# Patient Record
Sex: Male | Born: 1975
Health system: Southern US, Community
[De-identification: ages and names within clinical notes are randomized; demographics above are authoritative.]

## PROBLEM LIST (undated history)

## (undated) DIAGNOSIS — G4730 Sleep apnea, unspecified: Secondary | ICD-10-CM

## (undated) DIAGNOSIS — K219 Gastro-esophageal reflux disease without esophagitis: Secondary | ICD-10-CM

## (undated) DIAGNOSIS — E785 Hyperlipidemia, unspecified: Secondary | ICD-10-CM

## (undated) DIAGNOSIS — T7840XA Allergy, unspecified, initial encounter: Secondary | ICD-10-CM

## (undated) DIAGNOSIS — B029 Zoster without complications: Secondary | ICD-10-CM

## (undated) DIAGNOSIS — Z973 Presence of spectacles and contact lenses: Secondary | ICD-10-CM

## (undated) HISTORY — DX: Gastro-esophageal reflux disease without esophagitis: K21.9

## (undated) HISTORY — DX: Hyperlipidemia, unspecified: E78.5

## (undated) HISTORY — PX: WISDOM TOOTH EXTRACTION: SHX21

## (undated) HISTORY — DX: Allergy, unspecified, initial encounter: T78.40XA

## (undated) HISTORY — DX: Sleep apnea, unspecified: G47.30

## (undated) HISTORY — DX: Presence of spectacles and contact lenses: Z97.3

## (undated) HISTORY — PX: COLONOSCOPY: SHX174

---

## 2009-04-15 ENCOUNTER — Ambulatory Visit: Payer: Self-pay | Admitting: Occupational Medicine

## 2009-11-18 ENCOUNTER — Ambulatory Visit: Payer: Self-pay | Admitting: Emergency Medicine

## 2009-11-18 DIAGNOSIS — T6391XA Toxic effect of contact with unspecified venomous animal, accidental (unintentional), initial encounter: Secondary | ICD-10-CM | POA: Insufficient documentation

## 2010-06-03 NOTE — Letter (Signed)
Summary: Out of Work  [facility]  [address]   [place], [place] [address]   Phone: [phone]  Fax: [phone]    November 18, 2009   Employee:  [employer]    To Whom It May Concern:   For Medical reasons, please excuse the above named employee from work for the following dates:  July 18th, 2011   If you need additional information, please feel free to contact our office.         Sincerely,    Hoyt Koch MD

## 2010-06-03 NOTE — Assessment & Plan Note (Signed)
Summary: SWELLING LEFT KNEE FROM YELLOW JACKET ATTACK/TJ   Vital Signs:  Patient Profile:   35 Years Old Male CC:      several yellow jacket stings X yesterday Height:     72 inches Weight:      205 pounds O2 Sat:      98 % O2 treatment:    Room Air Temp:     97.2 degrees F oral Pulse rate:   93 / minute Resp:     14 per minute BP sitting:   123 / 81  (left arm) Cuff size:   large  Pt. in pain?   yes    Location:   left knee    Intensity:   6    Type:       burning  Vitals Entered By: Lajean Saver RN (November 18, 2009 3:12 PM)                   Updated Prior Medication List: No Medications Current Allergies (reviewed today): No known allergies History of Present Illness Chief Complaint: several yellow jacket stings X yesterday History of Present Illness: He was stung by several yellow jackets yesterday while mowing.  Once on his left knee and once on his left ankle.  This has happened before but he's never had a reaction.  Now with redness, pain, swelling at both sites.  Didn't try any OTC meds. Pain is sharp.  No SOB or breathing problems  REVIEW OF SYSTEMS Constitutional Symptoms      Denies fever, chills, night sweats, weight loss, weight gain, and fatigue.  Eyes       Denies change in vision, eye pain, eye discharge, glasses, contact lenses, and eye surgery. Ear/Nose/Throat/Mouth       Denies hearing loss/aids, change in hearing, ear pain, ear discharge, dizziness, frequent runny nose, frequent nose bleeds, sinus problems, sore throat, hoarseness, and tooth pain or bleeding.  Respiratory       Denies dry cough, productive cough, wheezing, shortness of breath, asthma, bronchitis, and emphysema/COPD.  Cardiovascular       Denies murmurs, chest pain, and tires easily with exhertion.    Gastrointestinal       Denies stomach pain, nausea/vomiting, diarrhea, constipation, blood in bowel movements, and indigestion. Genitourniary       Denies painful urination, kidney  stones, and loss of urinary control. Neurological       Denies paralysis, seizures, and fainting/blackouts. Musculoskeletal       Complains of redness and swelling.      Denies muscle pain, joint pain, joint stiffness, decreased range of motion, muscle weakness, and gout.      Comments: left knee and ankle Skin       Denies bruising, unusual mles/lumps or sores, and hair/skin or nail changes.      Comments: "puncture" wound/stings from yellow jackets Psych       Denies mood changes, temper/anger issues, anxiety/stress, speech problems, depression, and sleep problems. Other Comments: patient was stung by several yellow jackets yesterday. The greatest reaction is to his left knee. He denies an allergy to bee/yellow jacket stings. Denies SOB. Cold pack applied to left knee   Past History:  Past Medical History: Reviewed history from 04/15/2009 and no changes required. Unremarkable  Past Surgical History: Reviewed history from 04/15/2009 and no changes required. Denies surgical history  Family History: Reviewed history from 04/15/2009 and no changes required. Mother, Healthy Father, Diabetes, [place]  Social History: Reviewed history from 04/15/2009 and  no changes required. Non-smoker ETOH-yes No DRugs IT Physical Exam General appearance: well developed, well nourished, no acute distress Oral/Pharynx: tongue normal, posterior pharynx without erythema or exudate.  patent Chest/Lungs: no rales, wheezes, or rhonchi bilateral, breath sounds equal without effort Heart: regular rate and  rhythm, no murmur Extremities: FROM Skin: Left knee and left lateral ankle with apparent bee stings and surrounding erythema, warmth, and tenderness.  Clear discharge from knee wound. MSE: oriented to time, place, and person Assessment New Problems: BEE STING REACTION, LOCAL (ICD-989.5)  It is unclear whether this is cellulitis or simple inflammation from the sting. Patient prefers to wait and not get  prednisone or injection at this point  Patient Education: Patient and/or caregiver instructed in the following: rest, Tylenol prn, Ibuprofen prn.  Plan New Medications/Changes: DOXYCYCLINE HYCLATE 100 MG CAPS (DOXYCYCLINE HYCLATE) 1 tab by mouth two times a day for 10 days  #20 x 0, 11/18/2009, Hoyt Koch MD  New Orders: New Patient Level II [phone] Planning Comments:   Elevate and ice areas.  Advised to mark redness borders with a pen, and if spreading despite the antibiotics, return to clinic or f/u with your PCP .  Avoid bees and other stinging insects.  Benedryl may help.   The patient and/or caregiver has been counseled thoroughly with regard to medications prescribed including dosage, schedule, interactions, rationale for use, and possible side effects and they verbalize understanding.  Diagnoses and expected course of recovery discussed and will return if not improved as expected or if the condition worsens. Patient and/or caregiver verbalized understanding.  Prescriptions: DOXYCYCLINE HYCLATE 100 MG CAPS (DOXYCYCLINE HYCLATE) 1 tab by mouth two times a day for 10 days  #20 x 0   Entered and Authorized by:   Hoyt Koch MD   Signed by:   Hoyt Koch MD on 11/18/2009   Method used:   Print then Give to Patient   RxID:   [id number]   Orders Added: 1)  New Patient Level II [08657]

## 2010-11-29 ENCOUNTER — Encounter: Payer: Self-pay | Admitting: Family Medicine

## 2010-12-04 ENCOUNTER — Encounter: Payer: Self-pay | Admitting: Family Medicine

## 2010-12-04 ENCOUNTER — Ambulatory Visit (INDEPENDENT_AMBULATORY_CARE_PROVIDER_SITE_OTHER): Payer: BC Managed Care – PPO | Admitting: Family Medicine

## 2010-12-04 VITALS — BP 124/82 | HR 65 | Ht 71.75 in | Wt 212.0 lb

## 2010-12-04 DIAGNOSIS — Z0000 Encounter for general adult medical examination without abnormal findings: Secondary | ICD-10-CM

## 2010-12-04 DIAGNOSIS — M778 Other enthesopathies, not elsewhere classified: Secondary | ICD-10-CM

## 2010-12-04 DIAGNOSIS — R6884 Jaw pain: Secondary | ICD-10-CM

## 2010-12-04 DIAGNOSIS — Z23 Encounter for immunization: Secondary | ICD-10-CM

## 2010-12-04 NOTE — Patient Instructions (Signed)
Review handout for exercises for your wrist Wear the braces during the daytime for one week. Can take Aleve twice a day for one week as well, with food and water.  Then dec time to half days and then can start the exercises Looks at your work position of your wrist, and keep them elevated.

## 2010-12-04 NOTE — Progress Notes (Signed)
Subjective:    Patient ID: Micheal Hines, male    DOB: 06-08-1975, 35 y.o.   MRN: [id number]  HPI  He is healthy male who is hear for a CPE. Last was about 3-4 yr ago. Says last chol was borderline.   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  Has soreness in both wrists, but worse on the left.  Also notices popping in the left wrist which is uncomfortable.  hasn't really taken any meds for his wrist.  Did buy some braces and says they do help.  Occ numbness and tinlging in fingers but rarely. Only rarely does it bother him at night.   Left Jaw pain intermittantly for 10 years.  Says painful when shifts jaw outwards.  Will occ feel a pop when eating.  Occ uncomfortable.  No sure if griding teeth at night. Says doesn't chew gum.  No prior injury or surgery to the jaw.  Has dentist appt in the next few weeks.   Review of Systems  Constitutional: Negative for fever, diaphoresis and unexpected weight change.  HENT: Negative for hearing loss, rhinorrhea, sneezing and tinnitus.   Eyes: Negative for visual disturbance.  Respiratory: Negative for cough and wheezing.   Cardiovascular: Negative for chest pain and palpitations.  Gastrointestinal: Negative for nausea, vomiting, diarrhea and blood in stool.  Genitourinary: Negative for dysuria and discharge.  Musculoskeletal: Negative for myalgias and arthralgias.  Skin: Negative for rash.  Neurological: Negative for headaches.  Hematological: Negative for adenopathy.  Psychiatric/Behavioral: Negative for sleep disturbance and dysphoric mood. The patient is not nervous/anxious.    BP 124/82  Pulse 65  Ht 5' 11.75" (1.822 m)  Wt 212 lb (96.163 kg)  BMI 28.95 kg/m2    No Known Allergies  History reviewed. No pertinent past medical history.  Past Surgical History  Procedure Date  . Wisdom tooth extraction     History   Social History  . Marital Status: Married    Spouse Name: N/A    Number of Children: N/A  . Years of Education: N/A    Occupational History  . Not on file.   Social History Main Topics  . Smoking status: Never Smoker   . Smokeless tobacco: Not on file  . Alcohol Use: Yes  . Drug Use: No  . Sexually Active: Not on file   Other Topics Concern  . Not on file   Social History Narrative  . No narrative on file    Family History  Problem Relation Age of Onset  . Diabetes Father   . Prostate cancer Maternal Grandfather   . Breast cancer Paternal Grandmother   . Hyperlipidemia Father     Micheal Hines does not currently have medications on file.     Objective:   Physical Exam  Constitutional: He appears well-developed and well-nourished.  Musculoskeletal:       Wrists with NROM and strength. Fingers with 6/6 strength. Neg tinnels and phalens sign.  Nontender and no swelling over the wrist joints.            Assessment & Plan:  Wrist tendonitis - Discussed dx. Recommend wearing splints during work for one week then dec to half a day for one week. NSAID for one week. Then start exercises for wrists. H.O given. Follow up if not improving in 3-4 weeks. Discussed looking at wrist position at work that is likely the exacerbating factor for his wrists.    Jaw Pain - Likely TMJ intermittantly over the years. Some  popping can be normal. Gave him reassurance and discussed not chewing gum, avoiding clenching his jaw and talking to his dentist to make sure no signs of nightime grinding of his teeth. They might even be able to xray jaw when they are xraying teeth to make sure looks ok.    He plans on scheduleing a CPE soon so will go ahead and give lab slip.

## 2010-12-05 ENCOUNTER — Telehealth: Payer: Self-pay | Admitting: Family Medicine

## 2010-12-05 LAB — COMPLETE METABOLIC PANEL WITH GFR
ALT: 27 U/L (ref 0–53)
CO2: 24 mEq/L (ref 19–32)
Calcium: 9.5 mg/dL (ref 8.4–10.5)
Chloride: 106 mEq/L (ref 96–112)
Creat: 0.88 mg/dL (ref 0.50–1.35)
GFR, Est African American: >60 mL/min (ref >60)
Total Protein: 7.4 g/dL (ref 6.0–8.3)

## 2010-12-05 LAB — LIPID PANEL
LDL Cholesterol: 155 mg/dL — ABNORMAL HIGH (ref 0–99)
Triglycerides: 160 mg/dL — ABNORMAL HIGH (ref <150)

## 2010-12-05 NOTE — Telephone Encounter (Signed)
Pt.notified

## 2010-12-05 NOTE — Telephone Encounter (Signed)
Call patient: Complete metabolic panel is normal. His cholesterol is high. It was rather high as well. I would like him to work on low-fat diet and regular exercise and recheck cholesterol in 6 months. If the cholesterol is also low and exercise will improve this.

## 2010-12-24 ENCOUNTER — Encounter: Payer: Self-pay | Admitting: Family Medicine

## 2011-01-07 ENCOUNTER — Ambulatory Visit (INDEPENDENT_AMBULATORY_CARE_PROVIDER_SITE_OTHER): Payer: BC Managed Care – PPO | Admitting: Family Medicine

## 2011-01-07 ENCOUNTER — Encounter: Payer: Self-pay | Admitting: Family Medicine

## 2011-01-07 VITALS — BP 115/71 | HR 74 | Ht 71.75 in | Wt 209.0 lb

## 2011-01-07 DIAGNOSIS — Z23 Encounter for immunization: Secondary | ICD-10-CM

## 2011-01-07 DIAGNOSIS — Z0000 Encounter for general adult medical examination without abnormal findings: Secondary | ICD-10-CM

## 2011-01-15 ENCOUNTER — Encounter: Payer: Self-pay | Admitting: Family Medicine

## 2011-01-15 NOTE — Patient Instructions (Signed)
Return in 1 year for follow up

## 2011-01-15 NOTE — Progress Notes (Signed)
  Subjective:    Patient ID: Micheal Hines, male    DOB: 1976/04/10, 35 y.o.   MRN: [id number]  HPI Patient is here for a PE. Reports no problem.   Review of Systems  Constitutional: Negative.   HENT: Negative.   Eyes: Negative.   Respiratory: Negative.   Cardiovascular: Negative.   Gastrointestinal: Negative.   Genitourinary: Negative.   Musculoskeletal: Negative.   Neurological: Negative.   Psychiatric/Behavioral: Negative.        Objective:   Physical Exam  Constitutional: He is oriented to person, place, and time. He appears well-developed and well-nourished.  HENT:  Head: Normocephalic and atraumatic.  Eyes: Pupils are equal, round, and reactive to light.  Neck: Normal range of motion. Neck supple.  Cardiovascular: Normal rate, regular rhythm and normal heart sounds.   Pulmonary/Chest: Effort normal and breath sounds normal.  Abdominal: Soft. Bowel sounds are normal.  Genitourinary: Rectum normal, prostate normal and penis normal. Guaiac negative stool.  Musculoskeletal: Normal range of motion.  Neurological: He is alert and oriented to person, place, and time. He has normal reflexes.  Skin: Skin is warm and dry.  Psychiatric: He has a normal mood and affect. His behavior is normal.          Assessment & Plan:   Return in 1 year for reevaluation.

## 2011-07-09 ENCOUNTER — Ambulatory Visit (INDEPENDENT_AMBULATORY_CARE_PROVIDER_SITE_OTHER): Payer: BC Managed Care – PPO | Admitting: Family Medicine

## 2011-07-09 ENCOUNTER — Ambulatory Visit: Payer: BC Managed Care – PPO | Admitting: Family Medicine

## 2011-07-09 ENCOUNTER — Encounter: Payer: Self-pay | Admitting: Family Medicine

## 2011-07-09 VITALS — BP 120/72 | HR 75 | Ht 71.75 in | Wt 210.0 lb

## 2011-07-09 DIAGNOSIS — R17 Unspecified jaundice: Secondary | ICD-10-CM

## 2011-07-09 DIAGNOSIS — E785 Hyperlipidemia, unspecified: Secondary | ICD-10-CM

## 2011-07-09 NOTE — Progress Notes (Signed)
  Subjective:    Patient ID: Micheal Hines, male    DOB: 03/19/1976, 36 y.o.   MRN: [id number]  Hyperlipidemia This is a new problem. This is a new diagnosis. Condition status: will reevaluate. He has no history of chronic renal disease or diabetes. Pertinent negatives include no chest pain, focal sensory loss, focal weakness or leg pain. He is currently on no antihyperlipidemic treatment. There are no compliance problems.  Risk factors for coronary artery disease include male sex and a sedentary lifestyle.    He has a lesion on the L thigh Review of Systems  Cardiovascular: Negative for chest pain.  Neurological: Negative for focal weakness.  All other systems reviewed and are negative.      BP 120/72  Pulse 75  Ht 5' 11.75" (1.822 m)  Wt 210 lb (95.255 kg)  BMI 28.68 kg/m2  SpO2 99% Objective:   Physical Exam  Constitutional: He is oriented to person, place, and time. He appears well-developed and well-nourished.  HENT:  Head: Normocephalic.  Neck: Normal range of motion. Neck supple.  Cardiovascular: Normal rate, regular rhythm and normal heart sounds.   Pulmonary/Chest: Effort normal.  Neurological: He is alert and oriented to person, place, and time.  Skin: Skin is warm and dry.       Lesion on L thigh          Assessment & Plan:   #1 hyperlipidemia talked about some natural alternatives. We'll check a lipid panel  #2  mildly elevated bilirubin check bilirubin level just make sure this is not an ongoing problem #3 will also check a A1c to evaluate diabetic status

## 2011-07-09 NOTE — Patient Instructions (Signed)
Bilirubin This is a test of the bile in the blood that is formed in the liver. This is composed mainly of broken down hemoglobin. Hemoglobin is the oxygen carrying material in your red blood cells. Hemoglobin is necessary to help the red blood cells carry oxygen throughout the body. When the bilirubin becomes too high, a person will become jaundiced. This means your skin turns yellow and the whites of your eyes turn yellow. This test is commonly done on newborn babies because their livers are not mature enough. This means the liver is not well enough developed to handle the breakdown products of the red blood cells. The bilirubin can become too high and eventually cause brain damage. When this test is elevated in a newborn, phototherapy is often used. This is a light therapy which helps break down the bilirubin in the blood to a form that can be gotten rid of by the kidneys. PREPARATION FOR TEST No preparation or fasting is needed. A blood sample or skin prick will be used to obtain a sample for testing. NORMAL FINDINGS Adult/elderly/child:  Total bilirubin: 0.3 to 1.0 mg/dl or 5.1 to 17 umol/L (SI units)   Indirect bilirubin: 0.2 to 0.8 mg/dl or 3.4 to 16.1 umol/L (SI units)   Direct bilirubin: 1.0 to 12.0 mg/dl or 09.6 to 045 umol/L (SI units)  Newborn:   Total bilirubin: 1.0 to 12.0 mg/dl or 40.9 to 811 umol/L (SI units)  Ranges for normal findings may vary among different laboratories and hospitals. You should always check with your doctor after having lab work or other tests done to discuss the meaning of your test results and whether your values are considered within normal limits. MEANING OF TEST  Your caregiver will go over the test results with you. Your caregiver will discuss the importance and meaning of your results. He or she will also discuss treatment options and additional tests, if needed. OBTAINING THE TEST RESULTS  It is your responsibility to obtain your test results. Ask the  lab or department performing the test when and how you will get your results. Document Released: 05/12/2004 Document Revised: 04/09/2011 Document Reviewed: 03/27/2008 [facility] Patient Information 2012 [place], [place].Hypertriglyceridemia  Diet for High blood levels of Triglycerides Most fats in food are triglycerides. Triglycerides in your blood are stored as fat in your body. High levels of triglycerides in your blood may put you at a greater risk for heart disease and stroke.  Normal triglyceride levels are less than 150 mg/dL. Borderline high levels are 150-199 mg/dl. High levels are 200 - 499 mg/dL, and very high triglyceride levels are greater than 500 mg/dL. The decision to treat high triglycerides is generally based on the level. For people with borderline or high triglyceride levels, treatment includes weight loss and exercise. Drugs are recommended for people with very high triglyceride levels. Many people who need treatment for high triglyceride levels have metabolic syndrome. This syndrome is a collection of disorders that often include: insulin resistance, high blood pressure, blood clotting problems, high cholesterol and triglycerides. TESTING PROCEDURE FOR TRIGLYCERIDES  You should not eat 4 hours before getting your triglycerides measured. The normal range of triglycerides is between 10 and 250 milligrams per deciliter (mg/dl). Some people may have extreme levels (1000 or above), but your triglyceride level may be too high if it is above 150 mg/dl, depending on what other risk factors you have for heart disease.   People with high blood triglycerides may also have high blood cholesterol levels. If you  have high blood cholesterol as well as high blood triglycerides, your risk for heart disease is probably greater than if you only had high triglycerides. High blood cholesterol is one of the main risk factors for heart disease.  CHANGING YOUR DIET  Your weight can affect your blood  triglyceride level. If you are more than 20% above your ideal body weight, you may be able to lower your blood triglycerides by losing weight. Eating less and exercising regularly is the best way to combat this. Fat provides more calories than any other food. The best way to lose weight is to eat less fat. Only 30% of your total calories should come from fat. Less than 7% of your diet should come from saturated fat. A diet low in fat and saturated fat is the same as a diet to decrease blood cholesterol. By eating a diet lower in fat, you may lose weight, lower your blood cholesterol, and lower your blood triglyceride level.  Eating a diet low in fat, especially saturated fat, may also help you lower your blood triglyceride level. Ask your dietitian to help you figure how much fat you can eat based on the number of calories your caregiver has prescribed for you.  Exercise, in addition to helping with weight loss may also help lower triglyceride levels.   Alcohol can increase blood triglycerides. You may need to stop drinking alcoholic beverages.   Too much carbohydrate in your diet may also increase your blood triglycerides. Some complex carbohydrates are necessary in your diet. These may include bread, rice, potatoes, other starchy vegetables and cereals.   Reduce "simple" carbohydrates. These may include pure sugars, candy, honey, and jelly without losing other nutrients. If you have the kind of high blood triglycerides that is affected by the amount of carbohydrates in your diet, you will need to eat less sugar and less high-sugar foods. Your caregiver can help you with this.   Adding 2-4 grams of fish oil (EPA+ DHA) may also help lower triglycerides. Speak with your caregiver before adding any supplements to your regimen.  Following the Diet  Maintain your ideal weight. Your caregivers can help you with a diet. Generally, eating less food and getting more exercise will help you lose weight. Joining a  weight control group may also help. Ask your caregivers for a good weight control group in your area.  Eat low-fat foods instead of high-fat foods. This can help you lose weight too.  These foods are lower in fat. Eat MORE of these:   Dried beans, peas, and lentils.   Egg whites.   Low-fat cottage cheese.   Fish.   Lean cuts of meat, such as round, sirloin, rump, and flank (cut extra fat off meat you fix).   Whole grain breads, cereals and pasta.   Skim and nonfat dry milk.   Low-fat yogurt.   Poultry without the skin.   Cheese made with skim or part-skim milk, such as mozzarella, parmesan, farmers', ricotta, or pot cheese.  These are higher fat foods. Eat LESS of these:   Whole milk and foods made from whole milk, such as American, blue, cheddar, monterey jack, and swiss cheese   High-fat meats, such as luncheon meats, sausages, knockwurst, bratwurst, hot dogs, ribs, corned beef, ground pork, and regular ground beef.   Fried foods.  Limit saturated fats in your diet. Substituting unsaturated fat for saturated fat may decrease your blood triglyceride level. You will need to read package labels to know which products  contain saturated fats.  These foods are high in saturated fat. Eat LESS of these:   Fried pork skins.   Whole milk.   Skin and fat from poultry.   Palm oil.   Butter.   Shortening.   Cream cheese.   Tomasa Blase.   Margarines and baked goods made from listed oils.   Vegetable shortenings.   Chitterlings.   Fat from meats.   Coconut oil.   Palm kernel oil.   Lard.   Cream.   Sour cream.   Fatback.   Coffee whiteners and non-dairy creamers made with these oils.   Cheese made from whole milk.  Use unsaturated fats (both polyunsaturated and monounsaturated) moderately. Remember, even though unsaturated fats are better than saturated fats; you still want a diet low in total fat.  These foods are high in unsaturated fat:   Canola oil.    Sunflower oil.   Mayonnaise.   Almonds.   Peanuts.   Pine nuts.   Margarines made with these oils.   Safflower oil.   Olive oil.   Avocados.   Cashews.   Peanut butter.   Sunflower seeds.   Soybean oil.   Peanut oil.   Olives.   Pecans.   Walnuts.   Pumpkin seeds.  Avoid sugar and other high-sugar foods. This will decrease carbohydrates without decreasing other nutrients. Sugar in your food goes rapidly to your blood. When there is excess sugar in your blood, your liver may use it to make more triglycerides. Sugar also contains calories without other important nutrients.  Eat LESS of these:   Sugar, brown sugar, powdered sugar, jam, jelly, preserves, honey, syrup, molasses, pies, candy, cakes, cookies, frosting, pastries, colas, soft drinks, punches, fruit drinks, and regular gelatin.   Avoid alcohol. Alcohol, even more than sugar, may increase blood triglycerides. In addition, alcohol is high in calories and low in nutrients. Ask for sparkling water, or a diet soft drink instead of an alcoholic beverage.  Suggestions for planning and preparing meals   Bake, broil, grill or roast meats instead of frying.   Remove fat from meats and skin from poultry before cooking.   Add spices, herbs, lemon juice or vinegar to vegetables instead of salt, rich sauces or gravies.   Use a non-stick skillet without fat or use no-stick sprays.   Cool and refrigerate stews and broth. Then remove the hardened fat floating on the surface before serving.   Refrigerate meat drippings and skim off fat to make low-fat gravies.   Serve more fish.   Use less butter, margarine and other high-fat spreads on bread or vegetables.   Use skim or reconstituted non-fat dry milk for cooking.   Cook with low-fat cheeses.   Substitute low-fat yogurt or cottage cheese for all or part of the sour cream in recipes for sauces, dips or congealed salads.   Use half yogurt/half mayonnaise in  salad recipes.   Substitute evaporated skim milk for cream. Evaporated skim milk or reconstituted non-fat dry milk can be whipped and substituted for whipped cream in certain recipes.   Choose fresh fruits for dessert instead of high-fat foods such as pies or cakes. Fruits are naturally low in fat.  When Dining Out   Order low-fat appetizers such as fruit or vegetable juice, pasta with vegetables or tomato sauce.   Select clear, rather than cream soups.   Ask that dressings and gravies be served on the side. Then use less of them.   Order foods that  are baked, broiled, poached, steamed, stir-fried, or roasted.   Ask for margarine instead of butter, and use only a small amount.   Drink sparkling water, unsweetened tea or coffee, or diet soft drinks instead of alcohol or other sweet beverages.  QUESTIONS AND ANSWERS ABOUT OTHER FATS IN THE BLOOD: SATURATED FAT, TRANS FAT, AND CHOLESTEROL What is trans fat? Trans fat is a type of fat that is formed when vegetable oil is hardened through a process called hydrogenation. This process helps makes foods more solid, gives them shape, and prolongs their shelf life. Trans fats are also called hydrogenated or partially hydrogenated oils.  What do saturated fat, trans fat, and cholesterol in foods have to do with heart disease? Saturated fat, trans fat, and cholesterol in the diet all raise the level of LDL "bad" cholesterol in the blood. The higher the LDL cholesterol, the greater the risk for coronary heart disease (CHD). Saturated fat and trans fat raise LDL similarly.  What foods contain saturated fat, trans fat, and cholesterol? High amounts of saturated fat are found in animal products, such as fatty cuts of meat, chicken skin, and full-fat dairy products like butter, whole milk, cream, and cheese, and in tropical vegetable oils such as palm, palm kernel, and coconut oil. Trans fat is found in some of the same foods as saturated fat, such as  vegetable shortening, some margarines (especially hard or stick margarine), crackers, cookies, baked goods, fried foods, salad dressings, and other processed foods made with partially hydrogenated vegetable oils. Small amounts of trans fat also occur naturally in some animal products, such as milk products, beef, and lamb. Foods high in cholesterol include liver, other organ meats, egg yolks, shrimp, and full-fat dairy products. How can I use the new food label to make heart-healthy food choices? Check the Nutrition Facts panel of the food label. Choose foods lower in saturated fat, trans fat, and cholesterol. For saturated fat and cholesterol, you can also use the Percent Daily Value (%DV): 5% DV or less is low, and 20% DV or more is high. (There is no %DV for trans fat.) Use the Nutrition Facts panel to choose foods low in saturated fat and cholesterol, and if the trans fat is not listed, read the ingredients and limit products that list shortening or hydrogenated or partially hydrogenated vegetable oil, which tend to be high in trans fat. POINTS TO REMEMBER: YOU NEED A LITTLE TLC (THERAPEUTIC LIFESTYLE CHANGES)  Discuss your risk for heart disease with your caregivers, and take steps to reduce risk factors.   Change your diet. Choose foods that are low in saturated fat, trans fat, and cholesterol.   Add exercise to your daily routine if it is not already being done. Participate in physical activity of moderate intensity, like brisk walking, for at least 30 minutes on most, and preferably all days of the week. No time? Break the 30 minutes into three, 10-minute segments during the day.   Stop smoking. If you do smoke, contact your caregiver to discuss ways in which they can help you quit.   Do not use street drugs.   Maintain a normal weight.   Maintain a healthy blood pressure.   Keep up with your blood work for checking the fats in your blood as directed by your caregiver.  Document Released:  02/06/2004 Document Revised: 04/09/2011 Document Reviewed: 09/03/2008 [facility] Patient Information 2012 [place], [place].

## 2011-07-11 LAB — LIPID PANEL
Cholesterol: 196 mg/dL (ref 0–200)
Triglycerides: 108 mg/dL (ref <150)

## 2011-07-11 LAB — HEMOGLOBIN A1C
Hgb A1c MFr Bld: 5.4 % (ref <5.7)
Mean Plasma Glucose: 108 mg/dL (ref <117)

## 2011-08-07 ENCOUNTER — Encounter: Payer: Self-pay | Admitting: *Deleted

## 2011-08-13 ENCOUNTER — Encounter: Payer: Self-pay | Admitting: Family Medicine

## 2011-08-13 ENCOUNTER — Other Ambulatory Visit: Payer: Self-pay | Admitting: Family Medicine

## 2011-08-13 ENCOUNTER — Ambulatory Visit (INDEPENDENT_AMBULATORY_CARE_PROVIDER_SITE_OTHER): Payer: BC Managed Care – PPO | Admitting: Family Medicine

## 2011-08-13 VITALS — BP 131/75 | HR 85 | Ht 71.75 in | Wt 211.0 lb

## 2011-08-13 DIAGNOSIS — L989 Disorder of the skin and subcutaneous tissue, unspecified: Secondary | ICD-10-CM | POA: Insufficient documentation

## 2011-08-13 DIAGNOSIS — D485 Neoplasm of uncertain behavior of skin: Secondary | ICD-10-CM

## 2011-08-13 MED ORDER — MUPIROCIN 2 % EX OINT: TOPICAL_OINTMENT | Freq: Three times a day (TID) | CUTANEOUS | Status: DC

## 2011-08-13 NOTE — Patient Instructions (Signed)
Biopsy Care After Refer to this sheet in the next few weeks. These instructions provide you with information on caring for yourself after your procedure. Your caregiver may also give you more specific instructions. Your treatment has been planned according to current medical practices, but problems sometimes occur. Call your caregiver if you have any problems or questions after your procedure. If you had a fine needle biopsy, you may have soreness at the biopsy site for 1 to 2 days. If you had an open biopsy, you may have soreness at the biopsy site for 3 to 4 days. HOME CARE INSTRUCTIONS   You may resume normal diet and activities as directed.   Change bandages (dressings) as directed. If your wound was closed with a skin glue (adhesive), it will wear off and begin to peel in 7 days.   Only take over-the-counter or prescription medicines for pain, discomfort, or fever as directed by your caregiver.   Ask your caregiver when you can bathe and get your wound wet.  SEEK IMMEDIATE MEDICAL CARE IF:   You have increased bleeding (more than a small spot) from the biopsy site.   You notice redness, swelling, or increasing pain at the biopsy site.   You have pus coming from the biopsy site.   You have a fever.   You notice a bad smell coming from the biopsy site or dressing.   You have a rash, have difficulty breathing, or have any allergic problems.  MAKE SURE YOU:   Understand these instructions.   Will watch your condition.   Will get help right away if you are not doing well or get worse.  Document Released: 11/07/2004 Document Revised: 04/09/2011 Document Reviewed: 10/16/2010 [facility] Patient Information 2012 [place], [place].Biopsy A biopsy is a procedure in which small samples of tissue are removed from the body. The tissue is examined under a microscope. A biopsy may be done to determine the cause (diagnosis) of a condition or mass (tumor). A biopsy may also be done to determine the  best treatment for you. In some instances, a biopsy may be performed on normal tissue to determine if cancer has spread or if a transplanted organ is being rejected. There are 2 ways to obtain samples:  Fine needle biopsy. Samples are removed using a thin needle inserted through the skin.   Open biopsy. Samples are removed after a cut (incision) is made through the skin.  LET YOUR CAREGIVER KNOW ABOUT:  Allergies to food or medicine.   Medicines taken, including vitamins, herbs, eyedrops, over-the-counter medicines, and creams.   Use of steroids (by mouth or creams).   Previous problems with anesthetics or numbing medicines.   History of bleeding problems or blood clots.   Previous surgery.   Other health problems, including diabetes and kidney problems.   Possibility of pregnancy, if this applies.  RISKS AND COMPLICATIONS  Bleeding from the biopsy site. The risk of bleeding is higher if you have a bleeding disorder or are taking any blood thinning medicines (anticoagulants).   Infection.   Injury to organs or structures near the biopsy site.   Chronic pain at the biopsy site. This is defined as pain that lasts for more than 3 months.   Very rarely, a second biopsy may be required if not enough tissue was collected during the first biopsy.  BEFORE THE PROCEDURE Ask your caregiver what time you need to arrive for your procedure. Ask your caregiver whether you need to stop eating or drinking (fast)  before your procedure. Ask your caregiver about changing or stopping your regular medicines. A blood sample may be done to determine your blood clotting time. Medicine may be given to help you relax (sedative). PROCEDURE During a fine needle biopsy, you will be awake during the procedure. You will be positioned to allow the best possible access to the biopsy site. Let your caregiver know if the position is not comfortable. The biopsy site will be cleaned. A needle is inserted through your  skin. You may feel mild discomfort during this procedure. The needle is withdrawn once tissue samples have been removed. Pressure may be applied to the biopsy site to reduce swelling and to ensure that bleeding has stopped. The samples will be sent to be examined. During an open biopsy, you may be given medicine that numbs the area (local anesthetic) or medicine that makes you sleep (general anesthetic). An incision is made through the skin. A tissue sample or the entire mass is removed. The sample or mass will be sent to be examined. Sometimes, the sample or mass may be examined during the procedure. If the sample or mass contains cancer cells, further tissue or structures may be removed. The incision is then closed with stitches (sutures) or skin glue (adhesive). AFTER THE PROCEDURE Your recovery will be assessed and monitored. If there are no problems, you should be able to go home shortly after the procedure (outpatient). You will need to arrange for someone to drive you home if you received a sedative or pain relieving medicine during the procedure. Ask when your test results will be ready. Make sure you get your test results. Document Released: 04/17/2000 Document Revised: 04/09/2011 Document Reviewed: 10/16/2010 [facility] Patient Information 2012 [place], [place].

## 2011-08-13 NOTE — Progress Notes (Addendum)
Patient ID: Micheal Hines, male   DOB: 08/02/1975, 36 y.o.   MRN: [id number] Patient is here for skin lesion removal on his left eye please see the biopsy note for details.  Overview  Punch Biopsy Procedure Note   Punch Biopsy Procedure Note  Pre-operative Diagnosis: Suspicious lesion  Post-operative Diagnosis: same  Locations:left thigh  Indications: rapid growth  Anesthesia: Lidocaine 1% without epinephrine without added sodium bicarbonate  Procedure Details  History of allergy to iodine: no Patient informed of the risks (including bleeding and infection) and benefits of the  procedure and Verbal informed consent obtained.  The lesion and surrounding area was given a sterile prep using betadyne and draped in the usual sterile fashion. The skin was then stretched perpendicular to the skin tension lines and the lesion removed using the 6mm punch. The resulting ellipse was then closed. The wound was closed with 3-0 Nylon using simple interrupted stitches. Antibiotic ointment and a sterile dressing applied. The specimen was sent for pathologic examination. The patient tolerated the procedure well.  EBL: minimal blood loss   Findings: pending  Condition: Stable  Complications: none.  Plan: 1. Instructed to keep the wound dry and covered for 24-48h and clean thereafter. 2. Warning signs of infection were reviewed.   3. Recommended that the patient use NSAID as needed for pain.  4. Return for suture removal in 12 days.

## 2011-08-18 MED ORDER — MUPIROCIN 2 % EX OINT: TOPICAL_OINTMENT | Freq: Three times a day (TID) | CUTANEOUS | Status: DC

## 2011-08-18 NOTE — Progress Notes (Signed)
Addended by: Ellsworth Lennox on: 08/18/2011 08:09 AM   Modules accepted: Orders

## 2011-08-21 ENCOUNTER — Encounter: Payer: Self-pay | Admitting: Family Medicine

## 2011-08-21 ENCOUNTER — Encounter: Payer: Self-pay | Admitting: *Deleted

## 2011-08-21 ENCOUNTER — Ambulatory Visit (INDEPENDENT_AMBULATORY_CARE_PROVIDER_SITE_OTHER): Payer: BC Managed Care – PPO | Admitting: Family Medicine

## 2011-08-21 VITALS — BP 131/81 | HR 56 | Temp 98.4°F | Ht 71.75 in | Wt 212.0 lb

## 2011-08-21 DIAGNOSIS — L0390 Cellulitis, unspecified: Secondary | ICD-10-CM

## 2011-08-21 DIAGNOSIS — L0291 Cutaneous abscess, unspecified: Secondary | ICD-10-CM

## 2011-08-21 MED ORDER — MUPIROCIN 2 % EX OINT: TOPICAL_OINTMENT | Freq: Three times a day (TID) | CUTANEOUS | Status: AC

## 2011-08-21 MED ORDER — SULFAMETHOXAZOLE-TRIMETHOPRIM 800-160 MG PO TABS: 1.0000 | ORAL_TABLET | Freq: Two times a day (BID) | ORAL | Status: AC

## 2011-08-21 NOTE — Progress Notes (Signed)
  Subjective:    Patient ID: Micheal Hines, male    DOB: Jul 09, 1975, 36 y.o.   MRN: [id number]  HPI Redness around excision site status post biopsy of cyst on left thigh. He says over the last day or 2 it started to look more red it has become more tender and sore. He denies any active drainage or pus from the lesion.   Review of Systems     Objective:   Physical Exam  The sutures are intact and there is no active drainage or pus. The site does look very erythematous and is very tender and blanches very easily.      Assessment & Plan:   Early cellulitis around wound site. And treat with Bactrim twice a day for 10 days. Keep appointment on Tuesday for suture removal. If at that point in time it looks great and he may be a postoperative Bactrim. He can certainly discuss that with Dr. Thurmond Butts. Also he never picked up the prescription for Bactroban. Evidently was sent to the pharmacy but I think there was some confusion. I resent it to [facility] on [address]. He has been using over-the-counter triple antibiotic ointment instead.

## 2011-08-21 NOTE — Patient Instructions (Signed)
Keep appointment for Tuesday.

## 2011-08-25 ENCOUNTER — Encounter: Payer: Self-pay | Admitting: Family Medicine

## 2011-08-25 ENCOUNTER — Ambulatory Visit (INDEPENDENT_AMBULATORY_CARE_PROVIDER_SITE_OTHER): Payer: BC Managed Care – PPO | Admitting: Family Medicine

## 2011-08-25 VITALS — BP 105/67 | HR 69 | Ht 71.75 in | Wt 213.0 lb

## 2011-08-25 DIAGNOSIS — Z9889 Other specified postprocedural states: Secondary | ICD-10-CM

## 2011-08-25 DIAGNOSIS — Z4802 Encounter for removal of sutures: Secondary | ICD-10-CM

## 2011-08-25 NOTE — Patient Instructions (Signed)
Biopsy Care After Refer to this sheet in the next few weeks. These instructions provide you with information on caring for yourself after your procedure. Your caregiver may also give you more specific instructions. Your treatment has been planned according to current medical practices, but problems sometimes occur. Call your caregiver if you have any problems or questions after your procedure. If you had a fine needle biopsy, you may have soreness at the biopsy site for 1 to 2 days. If you had an open biopsy, you may have soreness at the biopsy site for 3 to 4 days. HOME CARE INSTRUCTIONS   You may resume normal diet and activities as directed.   Change bandages (dressings) as directed. If your wound was closed with a skin glue (adhesive), it will wear off and begin to peel in 7 days.   Only take over-the-counter or prescription medicines for pain, discomfort, or fever as directed by your caregiver.   Ask your caregiver when you can bathe and get your wound wet.  SEEK IMMEDIATE MEDICAL CARE IF:   You have increased bleeding (more than a small spot) from the biopsy site.   You notice redness, swelling, or increasing pain at the biopsy site.   You have pus coming from the biopsy site.   You have a fever.   You notice a bad smell coming from the biopsy site or dressing.   You have a rash, have difficulty breathing, or have any allergic problems.  MAKE SURE YOU:   Understand these instructions.   Will watch your condition.   Will get help right away if you are not doing well or get worse.  Document Released: 11/07/2004 Document Revised: 04/09/2011 Document Reviewed: 10/16/2010 [facility] Patient Information 2012 [place], [place].Wound Care Wound care helps prevent pain and infection.  You may need a tetanus shot if:  You cannot remember when you had your last tetanus shot.   You have never had a tetanus shot.   The injury broke your skin.  If you need a tetanus shot and you choose  not to have one, you may get tetanus. Sickness from tetanus can be serious. HOME CARE   Only take medicine as told by your doctor.   Clean the wound daily with mild soap and water.   Change any bandages (dressings) as told by your doctor.   Put medicated cream and a bandage on the wound as told by your doctor.   Change the bandage if it gets wet, dirty, or starts to smell.   Take showers. Do not take baths, swim, or do anything that puts your wound under water.   Rest and raise (elevate) the wound until the pain and puffiness (swelling) are better.   Keep all doctor visits as told.  GET HELP RIGHT AWAY IF:   Yellowish-white fluid (pus) comes from the wound.   Medicine does not lessen your pain.   There is a red streak going away from the wound.   You cannot move your finger or toe.   You have a fever.  MAKE SURE YOU:   Understand these instructions.   Will watch your condition.   Will get help right away if you are not doing well or get worse.  Document Released: 01/28/2008 Document Revised: 04/09/2011 Document Reviewed: 08/24/2010 [facility] Patient Information 2012 [place], [place].

## 2011-08-25 NOTE — Progress Notes (Signed)
Addended by: Hassan Rowan on: 08/25/2011 07:08 PM   Modules accepted: Level of Service

## 2011-08-25 NOTE — Progress Notes (Signed)
Patient ID: Micheal Hines, male   DOB: [birth year], 36 y.o.   MRN: [id number] Patient is here for suture removal. The suture site is intact sutures removed. Discussed findings of pathology report. Stress is benign lesion. Patient to return here for physical examination and recheck of cholesterol. Should be noted that he has become a vegetarian de facto from because his wife has become a vegetarian and only cooks fish at this time.

## 2012-01-14 ENCOUNTER — Encounter: Payer: Self-pay | Admitting: Family Medicine

## 2012-01-14 ENCOUNTER — Ambulatory Visit (INDEPENDENT_AMBULATORY_CARE_PROVIDER_SITE_OTHER): Payer: BC Managed Care – PPO | Admitting: Family Medicine

## 2012-01-14 VITALS — BP 122/79 | HR 96 | Ht 71.75 in | Wt 218.0 lb

## 2012-01-14 DIAGNOSIS — Z0000 Encounter for general adult medical examination without abnormal findings: Secondary | ICD-10-CM

## 2012-01-14 DIAGNOSIS — E785 Hyperlipidemia, unspecified: Secondary | ICD-10-CM

## 2012-01-14 DIAGNOSIS — M2550 Pain in unspecified joint: Secondary | ICD-10-CM

## 2012-01-14 DIAGNOSIS — Z1211 Encounter for screening for malignant neoplasm of colon: Secondary | ICD-10-CM

## 2012-01-14 LAB — CBC WITH DIFFERENTIAL/PLATELET
Eosinophils Relative: 3 % (ref 0–5)
HCT: 46.2 % (ref 39.0–52.0)
Lymphocytes Relative: 30 % (ref 12–46)
Lymphs Abs: 2 10*3/uL (ref 0.7–4.0)
MCV: 87.2 fL (ref 78.0–100.0)
Monocytes Absolute: 0.5 10*3/uL (ref 0.1–1.0)
Monocytes Relative: 7 % (ref 3–12)
RBC: 5.3 MIL/uL (ref 4.22–5.81)
WBC: 6.8 10*3/uL (ref 4.0–10.5)

## 2012-01-14 LAB — COMPLETE METABOLIC PANEL WITH GFR
BUN: 15 mg/dL (ref 6–23)
CO2: 22 mEq/L (ref 19–32)
Calcium: 9.5 mg/dL (ref 8.4–10.5)
Chloride: 106 mEq/L (ref 96–112)
Creat: 0.96 mg/dL (ref 0.50–1.35)
GFR, Est African American: >89 mL/min

## 2012-01-14 LAB — LIPID PANEL: HDL: 35 mg/dL — ABNORMAL LOW (ref >39)

## 2012-01-14 LAB — URIC ACID: Uric Acid, Serum: 6.1 mg/dL (ref 4.0–7.8)

## 2012-01-14 LAB — HEMOGLOBIN A1C: Hgb A1c MFr Bld: 5.2 % (ref <5.7)

## 2012-01-14 LAB — TSH: TSH: 1.106 u[IU]/mL (ref 0.350–4.500)

## 2012-01-14 MED ORDER — MOMETASONE FUROATE 0.1 % EX CREA: TOPICAL_CREAM | CUTANEOUS | Status: AC

## 2012-01-14 NOTE — Patient Instructions (Signed)
Hypertriglyceridemia  Diet for High blood levels of Triglycerides Most fats in food are triglycerides. Triglycerides in your blood are stored as fat in your body. High levels of triglycerides in your blood may put you at a greater risk for heart disease and stroke.  Normal triglyceride levels are less than 150 mg/dL. Borderline high levels are 150-199 mg/dl. High levels are 200 - 499 mg/dL, and very high triglyceride levels are greater than 500 mg/dL. The decision to treat high triglycerides is generally based on the level. For people with borderline or high triglyceride levels, treatment includes weight loss and exercise. Drugs are recommended for people with very high triglyceride levels. Many people who need treatment for high triglyceride levels have metabolic syndrome. This syndrome is a collection of disorders that often include: insulin resistance, high blood pressure, blood clotting problems, high cholesterol and triglycerides. TESTING PROCEDURE FOR TRIGLYCERIDES  You should not eat 4 hours before getting your triglycerides measured. The normal range of triglycerides is between 10 and 250 milligrams per deciliter (mg/dl). Some people may have extreme levels (1000 or above), but your triglyceride level may be too high if it is above 150 mg/dl, depending on what other risk factors you have for heart disease.   People with high blood triglycerides may also have high blood cholesterol levels. If you have high blood cholesterol as well as high blood triglycerides, your risk for heart disease is probably greater than if you only had high triglycerides. High blood cholesterol is one of the main risk factors for heart disease.  CHANGING YOUR DIET  Your weight can affect your blood triglyceride level. If you are more than 20% above your ideal body weight, you may be able to lower your blood triglycerides by losing weight. Eating less and exercising regularly is the best way to combat this. Fat provides  more calories than any other food. The best way to lose weight is to eat less fat. Only 30% of your total calories should come from fat. Less than 7% of your diet should come from saturated fat. A diet low in fat and saturated fat is the same as a diet to decrease blood cholesterol. By eating a diet lower in fat, you may lose weight, lower your blood cholesterol, and lower your blood triglyceride level.  Eating a diet low in fat, especially saturated fat, may also help you lower your blood triglyceride level. Ask your dietitian to help you figure how much fat you can eat based on the number of calories your caregiver has prescribed for you.  Exercise, in addition to helping with weight loss may also help lower triglyceride levels.   Alcohol can increase blood triglycerides. You may need to stop drinking alcoholic beverages.   Too much carbohydrate in your diet may also increase your blood triglycerides. Some complex carbohydrates are necessary in your diet. These may include bread, rice, potatoes, other starchy vegetables and cereals.   Reduce "simple" carbohydrates. These may include pure sugars, candy, honey, and jelly without losing other nutrients. If you have the kind of high blood triglycerides that is affected by the amount of carbohydrates in your diet, you will need to eat less sugar and less high-sugar foods. Your caregiver can help you with this.   Adding 2-4 grams of fish oil (EPA+ DHA) may also help lower triglycerides. Speak with your caregiver before adding any supplements to your regimen.  Following the Diet  Maintain your ideal weight. Your caregivers can help you with a diet. Generally,   eating less food and getting more exercise will help you lose weight. Joining a weight control group may also help. Ask your caregivers for a good weight control group in your area.  Eat low-fat foods instead of high-fat foods. This can help you lose weight too.  These foods are lower in fat. Eat MORE  of these:   Dried beans, peas, and lentils.   Egg whites.   Low-fat cottage cheese.   Fish.   Lean cuts of meat, such as round, sirloin, rump, and flank (cut extra fat off meat you fix).   Whole grain breads, cereals and pasta.   Skim and nonfat dry milk.   Low-fat yogurt.   Poultry without the skin.   Cheese made with skim or part-skim milk, such as mozzarella, parmesan, farmers', ricotta, or pot cheese.  These are higher fat foods. Eat LESS of these:   Whole milk and foods made from whole milk, such as American, blue, cheddar, monterey jack, and swiss cheese   High-fat meats, such as luncheon meats, sausages, knockwurst, bratwurst, hot dogs, ribs, corned beef, ground pork, and regular ground beef.   Fried foods.  Limit saturated fats in your diet. Substituting unsaturated fat for saturated fat may decrease your blood triglyceride level. You will need to read package labels to know which products contain saturated fats.  These foods are high in saturated fat. Eat LESS of these:   Fried pork skins.   Whole milk.   Skin and fat from poultry.   Palm oil.   Butter.   Shortening.   Cream cheese.   Bacon.   Margarines and baked goods made from listed oils.   Vegetable shortenings.   Chitterlings.   Fat from meats.   Coconut oil.   Palm kernel oil.   Lard.   Cream.   Sour cream.   Fatback.   Coffee whiteners and non-dairy creamers made with these oils.   Cheese made from whole milk.  Use unsaturated fats (both polyunsaturated and monounsaturated) moderately. Remember, even though unsaturated fats are better than saturated fats; you still want a diet low in total fat.  These foods are high in unsaturated fat:   Canola oil.   Sunflower oil.   Mayonnaise.   Almonds.   Peanuts.   Pine nuts.   Margarines made with these oils.   Safflower oil.   Olive oil.   Avocados.   Cashews.   Peanut butter.   Sunflower seeds.   Soybean oil.     Peanut oil.   Olives.   Pecans.   Walnuts.   Pumpkin seeds.  Avoid sugar and other high-sugar foods. This will decrease carbohydrates without decreasing other nutrients. Sugar in your food goes rapidly to your blood. When there is excess sugar in your blood, your liver may use it to make more triglycerides. Sugar also contains calories without other important nutrients.  Eat LESS of these:   Sugar, brown sugar, powdered sugar, jam, jelly, preserves, honey, syrup, molasses, pies, candy, cakes, cookies, frosting, pastries, colas, soft drinks, punches, fruit drinks, and regular gelatin.   Avoid alcohol. Alcohol, even more than sugar, may increase blood triglycerides. In addition, alcohol is high in calories and low in nutrients. Ask for sparkling water, or a diet soft drink instead of an alcoholic beverage.  Suggestions for planning and preparing meals   Bake, broil, grill or roast meats instead of frying.   Remove fat from meats and skin from poultry before cooking.   Add spices,   herbs, lemon juice or vinegar to vegetables instead of salt, rich sauces or gravies.   Use a non-stick skillet without fat or use no-stick sprays.   Cool and refrigerate stews and broth. Then remove the hardened fat floating on the surface before serving.   Refrigerate meat drippings and skim off fat to make low-fat gravies.   Serve more fish.   Use less butter, margarine and other high-fat spreads on bread or vegetables.   Use skim or reconstituted non-fat dry milk for cooking.   Cook with low-fat cheeses.   Substitute low-fat yogurt or cottage cheese for all or part of the sour cream in recipes for sauces, dips or congealed salads.   Use half yogurt/half mayonnaise in salad recipes.   Substitute evaporated skim milk for cream. Evaporated skim milk or reconstituted non-fat dry milk can be whipped and substituted for whipped cream in certain recipes.   Choose fresh fruits for dessert instead of  high-fat foods such as pies or cakes. Fruits are naturally low in fat.  When Dining Out   Order low-fat appetizers such as fruit or vegetable juice, pasta with vegetables or tomato sauce.   Select clear, rather than cream soups.   Ask that dressings and gravies be served on the side. Then use less of them.   Order foods that are baked, broiled, poached, steamed, stir-fried, or roasted.   Ask for margarine instead of butter, and use only a small amount.   Drink sparkling water, unsweetened tea or coffee, or diet soft drinks instead of alcohol or other sweet beverages.  QUESTIONS AND ANSWERS ABOUT OTHER FATS IN THE BLOOD: SATURATED FAT, TRANS FAT, AND CHOLESTEROL What is trans fat? Trans fat is a type of fat that is formed when vegetable oil is hardened through a process called hydrogenation. This process helps makes foods more solid, gives them shape, and prolongs their shelf life. Trans fats are also called hydrogenated or partially hydrogenated oils.  What do saturated fat, trans fat, and cholesterol in foods have to do with heart disease? Saturated fat, trans fat, and cholesterol in the diet all raise the level of LDL "bad" cholesterol in the blood. The higher the LDL cholesterol, the greater the risk for coronary heart disease (CHD). Saturated fat and trans fat raise LDL similarly.  What foods contain saturated fat, trans fat, and cholesterol? High amounts of saturated fat are found in animal products, such as fatty cuts of meat, chicken skin, and full-fat dairy products like butter, whole milk, cream, and cheese, and in tropical vegetable oils such as palm, palm kernel, and coconut oil. Trans fat is found in some of the same foods as saturated fat, such as vegetable shortening, some margarines (especially hard or stick margarine), crackers, cookies, baked goods, fried foods, salad dressings, and other processed foods made with partially hydrogenated vegetable oils. Small amounts of trans fat  also occur naturally in some animal products, such as milk products, beef, and lamb. Foods high in cholesterol include liver, other organ meats, egg yolks, shrimp, and full-fat dairy products. How can I use the new food label to make heart-healthy food choices? Check the Nutrition Facts panel of the food label. Choose foods lower in saturated fat, trans fat, and cholesterol. For saturated fat and cholesterol, you can also use the Percent Daily Value (%DV): 5% DV or less is low, and 20% DV or more is high. (There is no %DV for trans fat.) Use the Nutrition Facts panel to choose foods low in   saturated fat and cholesterol, and if the trans fat is not listed, read the ingredients and limit products that list shortening or hydrogenated or partially hydrogenated vegetable oil, which tend to be high in trans fat. POINTS TO REMEMBER: YOU NEED A LITTLE TLC (THERAPEUTIC LIFESTYLE CHANGES)  Discuss your risk for heart disease with your caregivers, and take steps to reduce risk factors.   Change your diet. Choose foods that are low in saturated fat, trans fat, and cholesterol.   Add exercise to your daily routine if it is not already being done. Participate in physical activity of moderate intensity, like brisk walking, for at least 30 minutes on most, and preferably all days of the week. No time? Break the 30 minutes into three, 10-minute segments during the day.   Stop smoking. If you do smoke, contact your caregiver to discuss ways in which they can help you quit.   Do not use street drugs.   Maintain a normal weight.   Maintain a healthy blood pressure.   Keep up with your blood work for checking the fats in your blood as directed by your caregiver.  Document Released: 02/06/2004 Document Revised: 04/09/2011 Document Reviewed: 09/03/2008 [employer] Patient Information 2012 [employer]. 

## 2012-01-14 NOTE — Progress Notes (Signed)
  Subjective:    Patient ID: Micheal Hines, male    DOB: Feb 19, 1976, 36 y.o.   MRN: [id number]  HPI Patient is here for PE and HX of hyperlipidemia   Review of Systems  Musculoskeletal: Positive for myalgias.       L ankle pain  Skin:       Concern over scar from biopsy  All other systems reviewed and are negative.   No Known Allergies History   Social History  . Marital Status: Married    Spouse Name: N/A    Number of Children: N/A  . Years of Education: N/A   Occupational History  . IT    Social History Main Topics  . Smoking status: Never Smoker   . Smokeless tobacco: Never Used  . Alcohol Use: Yes  . Drug Use: No  . Sexually Active: Yes    Birth Control/ Protection: Pill   Other Topics Concern  . Not on file   Social History Narrative  . No narrative on file   Family History  Problem Relation Age of Onset  . Diabetes Father   . Hyperlipidemia Father   . Prostate cancer Maternal Grandfather   . Breast cancer Paternal Grandmother    Past Medical History  Diagnosis Date  . Hyperlipidemia    Past Surgical History  Procedure Date  . Wisdom tooth extraction        BP 122/79  Pulse 96  Ht 5' 11.75" (1.822 m)  Wt 218 lb (98.884 kg)  BMI 29.77 kg/m2  SpO2 97% Objective:   Physical Exam  Vitals reviewed. Constitutional: He is oriented to person, place, and time. He appears well-developed and well-nourished.  HENT:  Head: Normocephalic and atraumatic.  Eyes: Conjunctivae normal are normal. Pupils are equal, round, and reactive to light.  Fundoscopic exam:      The right eye shows no arteriolar narrowing, no AV nicking, no exudate and no hemorrhage.       The left eye shows no arteriolar narrowing, no AV nicking, no exudate and no hemorrhage.  Neck: Normal range of motion. Neck supple. No tracheal deviation present. No thyromegaly present.  Cardiovascular: Normal rate, regular rhythm and normal heart sounds.   No murmur heard. Pulmonary/Chest:  Effort normal and breath sounds normal. No respiratory distress. He has no wheezes.  Abdominal: Soft. Bowel sounds are normal. He exhibits no distension. Hernia confirmed negative in the right inguinal area and confirmed negative in the left inguinal area.  Genitourinary: Rectum normal, prostate normal and testes normal. Rectal exam shows no fissure, no mass and no tenderness. Guaiac negative stool. Cremasteric reflex is present. Right testis shows no mass and no tenderness. Left testis shows no mass and no tenderness. Circumcised. No phimosis, paraphimosis, hypospadias or penile erythema. No discharge found.       Recent new pifgmented rash on tip of penis  Musculoskeletal: Normal range of motion.  Lymphadenopathy:       Right: No inguinal adenopathy present.       Left: No inguinal adenopathy present.  Neurological: He is alert and oriented to person, place, and time.  Skin: Skin is warm and dry.  Psychiatric: He has a normal mood and affect.       Assessment & Plan:  Health maintenance Rash on penis treat w/elocon cream Hyperlipidemia check levels Joint pain check uric acid If lipids are elevated return 6 months and if not one year for health maintence

## 2012-01-19 ENCOUNTER — Encounter: Payer: Self-pay | Admitting: Family Medicine

## 2012-01-28 ENCOUNTER — Telehealth: Payer: Self-pay | Admitting: *Deleted

## 2012-01-28 NOTE — Telephone Encounter (Signed)
Pt asking about his lab results. Please advise.

## 2012-01-28 NOTE — Telephone Encounter (Signed)
There should have been a letter sent can yu check on that for me.Thanks.

## 2012-01-28 NOTE — Telephone Encounter (Signed)
Letter was done. i called pt to inform him also and gave him the results.

## 2012-04-14 ENCOUNTER — Other Ambulatory Visit: Payer: Self-pay | Admitting: Obstetrics & Gynecology

## 2012-04-14 ENCOUNTER — Telehealth: Payer: Self-pay | Admitting: *Deleted

## 2012-04-14 DIAGNOSIS — IMO0002 Reserved for concepts with insufficient information to code with codable children: Secondary | ICD-10-CM

## 2012-04-14 NOTE — Telephone Encounter (Signed)
Cystic fibrosis testing ordered for pt and spouse

## 2012-04-19 LAB — CYSTIC FIBROSIS DIAGNOSTIC STUDY

## 2012-07-14 ENCOUNTER — Ambulatory Visit: Payer: BC Managed Care – PPO | Admitting: Family Medicine

## 2013-01-19 ENCOUNTER — Encounter: Payer: Self-pay | Admitting: Family Medicine

## 2013-01-19 ENCOUNTER — Ambulatory Visit (INDEPENDENT_AMBULATORY_CARE_PROVIDER_SITE_OTHER): Payer: BC Managed Care – PPO | Admitting: Family Medicine

## 2013-01-19 VITALS — BP 130/82 | HR 76 | Ht 71.75 in | Wt 221.0 lb

## 2013-01-19 DIAGNOSIS — Z23 Encounter for immunization: Secondary | ICD-10-CM

## 2013-01-19 DIAGNOSIS — Z131 Encounter for screening for diabetes mellitus: Secondary | ICD-10-CM

## 2013-01-19 DIAGNOSIS — Z0000 Encounter for general adult medical examination without abnormal findings: Secondary | ICD-10-CM

## 2013-01-19 DIAGNOSIS — R17 Unspecified jaundice: Secondary | ICD-10-CM

## 2013-01-19 DIAGNOSIS — E785 Hyperlipidemia, unspecified: Secondary | ICD-10-CM

## 2013-01-19 LAB — COMPLETE METABOLIC PANEL WITH GFR
ALT: 21 U/L (ref 0–53)
AST: 21 U/L (ref 0–37)
Alkaline Phosphatase: 52 U/L (ref 39–117)
CO2: 24 mEq/L (ref 19–32)
Sodium: 139 mEq/L (ref 135–145)
Total Bilirubin: 0.9 mg/dL (ref 0.3–1.2)
Total Protein: 7.1 g/dL (ref 6.0–8.3)

## 2013-01-19 LAB — LIPID PANEL
HDL: 31 mg/dL — ABNORMAL LOW (ref >39)
LDL Cholesterol: 123 mg/dL — ABNORMAL HIGH (ref 0–99)

## 2013-01-19 NOTE — Patient Instructions (Addendum)
Dr. Delan Ksiazek's General Advice Following Your Complete Physical Exam  The Benefits of Regular Exercise: Unless you suffer from an uncontrolled cardiovascular condition, studies strongly suggest that regular exercise and physical activity will add to both the quality and length of your life.  The World Health Organization recommends 150 minutes of moderate intensity aerobic activity every week.  This is best split over 3-4 days a week, and can be as simple as a brisk walk for just over 35 minutes "most days of the week".  This type of exercise has been shown to lower LDL-Cholesterol, lower average blood sugars, lower blood pressure, lower cardiovascular disease risk, improve memory, and increase one's overall sense of wellbeing.  The addition of anaerobic (or "strength training") exercises offers additional benefits including but not limited to increased metabolism, prevention of osteoporosis, and improved overall cholesterol levels.  How Can I Strive For A Low-Fat Diet?: Current guidelines recommend that 25-35 percent of your daily energy (food) intake should come from fats.  One might ask how can this be achieved without having to dissect each meal on a daily basis?  Switch to skim or 1% milk instead of whole milk.  Focus on lean meats such as ground turkey, fresh fish, baked chicken, and lean cuts of beef as your source of dietary protein.  Consume less than 300mg/day of dietary cholesterol.  Limit trans fatty acid consumption primarily by limiting synthetic trans fats such as partially hydrogenated oils (Ex: fried fast foods).  Focus efforts on reducing your intake of "solid" fats (Ex: Butter).  Substitute olive or vegetable oil for solid fats where possible.  Moderation of Salt Intake: Provided you don't carry a diagnosis of congestive heart failure nor renal failure, I recommend a daily allowance of no more than 2300 mg of salt (sodium).  Keeping under this daily goal is associated with a  decreased risk of cardiovascular events, creeping above it can lead to elevated blood pressures and increases your risk of cardiovascular events.  Milligrams (mg) of salt is listed on all nutrition labels, and your daily intake can add up faster than you think.  Most canned and frozen dinners can pack in over half your daily salt allowance in one meal.    Lifestyle Health Risks: Certain lifestyle choices carry specific health risks.  As you may already know, tobacco use has been associated with increasing one's risk of cardiovascular disease, pulmonary disease, numerous cancers, among many other issues.  What you may not know is that there are medications and nicotine replacement strategies that can more than double your chances of successfully quitting.  I would be thrilled to help manage your quitting strategy if you currently use tobacco products.  When it comes to alcohol use, I've yet to find an "ideal" daily allowance.  Provided an individual does not have a medical condition that is exacerbated by alcohol consumption, general guidelines determine "safe drinking" as no more than two standard drinks for a man or no more than one standard drink for a male per day.  However, much debate still exists on whether any amount of alcohol consumption is technically "safe".  My general advice, keep alcohol consumption to a minimum for general health promotion.  If you or others believe that alcohol, tobacco, or recreational drug use is interfering with your life, I would be happy to provide confidential counseling regarding treatment options.  General "Over The Counter" Nutrition Advice: Postmenopausal women should aim for a daily calcium intake of 1200 mg, however a significant   portion of this might already be provided by diets including milk, yogurt, cheese, and other dairy products.  Vitamin D has been shown to help preserve bone density, prevent fatigue, and has even been shown to help reduce falls in the  elderly.  Ensuring a daily intake of 800 Units of Vitamin D is a good place to start to enjoy the above benefits, we can easily check your Vitamin D level to see if you'd potentially benefit from supplementation beyond 800 Units a day.  Folic Acid intake should be of particular concern to women of childbearing age.  Daily consumption of 400-800 mcg of Folic Acid is recommended to minimize the chance of spinal cord defects in a fetus should pregnancy occur.    For many adults, accidents still remain one of the most common culprits when it comes to cause of death.  Some of the simplest but most effective preventitive habits you can adopt include regular seatbelt use, proper helmet use, securing firearms, and regularly testing your smoke and carbon monoxide detectors.  Alonnie Bieker B. Reakwon Barren DO [facility] [address], [address] , [place] [address] Phone: [phone]  Testicular Problems and Self-Exam Men can examine themselves easily and effectively with positive results. Monthly exams detect problems early and save lives. There are numerous causes of swelling in the testicle. Testicular cancer usually appears as a firm painless lump in the front part of the testicle. This may feel like a dull ache or heavy feeling located in the lower abdomen (belly), groin, or scrotum.  The risk is greater in men with undescended testicles and it is more common in young men. It is responsible for almost a fifth of cancers in males between ages 15 and 34. Other common causes of swellings, lumps, and testicular pain include injuries, inflammation (soreness) from infection, hydrocele, and torsion. These are a few of the reasons to do monthly self-examination of the testicles. The exam only takes minutes and could add years to your life. Get in the habit! SELF-EXAMINATION OF THE TESTICLES The testicles are easiest to examine after warm baths or showers and are more difficult to examine when you are cold. This  is because the muscles attached to the testicles retract and pull them up higher or into the abdomen. While standing, roll one testicle between the thumb and forefinger. Feel for lumps, swelling, or discomfort. A normal testicle is egg shaped and feels firm. It is smooth and not tender. The spermatic cord can be felt as a firm spaghetti-like cord at the back of the testicle. It is also important to examine your groins. This is the crease between the front of your leg and your abdomen. Also, feel for enlarged lymph nodes (glands). Enlarged nodes are also a cause for you to see your caregiver for evaluation.  Self-examination of the testicles and groin areas on a regular basis will help you to know what your own testicles and groins feel like. This will help you pick up an abnormality (difference) at an earlier stage. Early discovery is the key to curing this cancer or treating other conditions. Any lump, change, or swelling in the testicle calls for immediate evaluation by your caregiver. Cancer of the testicle does not result in impotence and it does not prevent normal intercourse or prevent having children. If your caregiver feels that medical treatment or chemotherapy could lead to infertility, sperm can be frozen for future use. It is necessary to see a caregiver as soon as possible after the   discovery of a lump in a testicle. Document Released: 07/27/2000 Document Revised: 07/13/2011 Document Reviewed: 04/21/2008 [employer] Patient Information 2014 [employer].  

## 2013-01-19 NOTE — Progress Notes (Signed)
CC: Micheal Hines is a 37 y.o. male is here for Annual Exam   Colonoscopy: Will start age 63 Prostate: Discussed screening risks/beneifts with patient on 01/19/2013 will start age 40 due to grandfather with Prostate Ca  Influenza Vaccine: will receive today Pneumovax: not indicated yet Td/Tdap: 2012 Zoster: (Start 37 yo)    Subjective: HPI:  No acute complaints  Tries to stay active and admits room for improvement with physical activity tries to watch what he eats. Rare alcohol use, no tobacco or recreational drug use.  Over the past 2 weeks have you been bothered by: - Little interest or pleasure in doing things: no - Feeling down depressed or hopeless: no  Review of Systems - General ROS: negative for - chills, fever, night sweats, weight gain or weight loss Ophthalmic ROS: negative for - decreased vision Psychological ROS: negative for - anxiety or depression ENT ROS: negative for - hearing change, nasal congestion, tinnitus or allergies Hematological and Lymphatic ROS: negative for - bleeding problems, bruising or swollen lymph nodes Breast ROS: negative Respiratory ROS: no cough, shortness of breath, or wheezing Cardiovascular ROS: no chest pain or dyspnea on exertion Gastrointestinal ROS: no abdominal pain, change in bowel habits, or black or bloody stools Genito-Urinary ROS: negative for - genital discharge, genital ulcers, incontinence or abnormal bleeding from genitals Musculoskeletal ROS: negative for - joint pain or muscle pain Neurological ROS: negative for - headaches or memory loss Dermatological ROS: negative for lumps, mole changes, rash and skin lesion changes  Review Of Systems Outlined In HPI  Past Medical History  Diagnosis Date  . Hyperlipidemia      Family History  Problem Relation Age of Onset  . Diabetes Father   . Hyperlipidemia Father   . Prostate cancer Maternal Grandfather   . Breast cancer Paternal Grandmother      History   Substance Use Topics  . Smoking status: Never Smoker   . Smokeless tobacco: Never Used  . Alcohol Use: Yes     Objective: Filed Vitals:   01/19/13 0840  BP: 130/82  Pulse: 76    General: No Acute Distress HEENT: Atraumatic, normocephalic, conjunctivae normal without scleral icterus.  No nasal discharge, hearing grossly intact, TMs with good landmarks bilaterally with no middle ear abnormalities, posterior pharynx clear without oral lesions. Neck: Supple, trachea midline, no cervical nor supraclavicular adenopathy. Pulmonary: Clear to auscultation bilaterally without wheezing, rhonchi, nor rales. Cardiac: Regular rate and rhythm.  No murmurs, rubs, nor gallops. No peripheral edema.  2+ peripheral pulses bilaterally. Abdomen: Bowel sounds normal.  No masses.  Non-tender without rebound.  Negative Murphy's sign. GU: no inguinal hernias MSK: Grossly intact, no signs of weakness.  Full strength throughout upper and lower extremities.  Full ROM in upper and lower extremities.  No midline spinal tenderness. Neuro: Gait unremarkable, CN II-XII grossly intact.  C5-C6 Reflex 2/4 Bilaterally, L4 Reflex 2/4 Bilaterally.  Cerebellar function intact. Skin: No rashes. seb keratosis overlying suprapubic fat pad Psych: Alert and oriented to person/place/time.  Thought process normal. No anxiety/depression.  Assessment & Plan: Zong was seen today for annual exam.  Diagnoses and associated orders for this visit:  Annual physical exam  Hyperlipidemia - Lipid panel  Diabetes mellitus screening - COMPLETE METABOLIC PANEL WITH GFR  Elevated bilirubin - COMPLETE METABOLIC PANEL WITH GFR  Need for prophylactic vaccination and inoculation against influenza     Healthy lifestyle interventions including but limited to regular exercise, a healthy low fat diet, moderation of salt intake,  the dangers of tobacco/alcohol/recreational drug use, nutrition supplementation, and accident avoidance were  discussed with the patient and a handout was provided for future reference. Complete metabolic panel due to history of elevated bilirubin, we'll screen for diabetes. Lipid panel due to history of hypercholesterolemia  Patient received Zofran 4 mg dissolving tablet due to feeling queasy during beginning of exam today symptoms resolved by end of visit.  Return in about 1 year (around 01/19/2014).

## 2014-01-19 ENCOUNTER — Encounter: Payer: Self-pay | Admitting: Family Medicine

## 2014-01-19 ENCOUNTER — Ambulatory Visit (INDEPENDENT_AMBULATORY_CARE_PROVIDER_SITE_OTHER): Payer: BC Managed Care – PPO | Admitting: Family Medicine

## 2014-01-19 VITALS — BP 132/86 | HR 96 | Ht 71.75 in | Wt 224.0 lb

## 2014-01-19 DIAGNOSIS — E785 Hyperlipidemia, unspecified: Secondary | ICD-10-CM

## 2014-01-19 DIAGNOSIS — Z0000 Encounter for general adult medical examination without abnormal findings: Secondary | ICD-10-CM

## 2014-01-19 LAB — LIPID PANEL
Cholesterol: 200 mg/dL (ref 0–200)
HDL: 31 mg/dL — ABNORMAL LOW (ref >39)
LDL CALC: 130 mg/dL — AB (ref 0–99)
TRIGLYCERIDES: 195 mg/dL — AB (ref <150)
Total CHOL/HDL Ratio: 6.5 Ratio
VLDL: 39 mg/dL (ref 0–40)

## 2014-01-19 LAB — COMPLETE METABOLIC PANEL WITH GFR
ALBUMIN: 4.5 g/dL (ref 3.5–5.2)
ALT: 26 U/L (ref 0–53)
AST: 21 U/L (ref 0–37)
Alkaline Phosphatase: 57 U/L (ref 39–117)
BUN: 11 mg/dL (ref 6–23)
CALCIUM: 9.6 mg/dL (ref 8.4–10.5)
CHLORIDE: 107 meq/L (ref 96–112)
CO2: 22 meq/L (ref 19–32)
CREATININE: 1.06 mg/dL (ref 0.50–1.35)
GFR, Est African American: >89 mL/min
GFR, Est Non African American: >89 mL/min
Glucose, Bld: 82 mg/dL (ref 70–99)
POTASSIUM: 4.5 meq/L (ref 3.5–5.3)
SODIUM: 139 meq/L (ref 135–145)
TOTAL PROTEIN: 7 g/dL (ref 6.0–8.3)
Total Bilirubin: 1 mg/dL (ref 0.2–1.2)

## 2014-01-19 LAB — CBC
HCT: 45.3 % (ref 39.0–52.0)
Hemoglobin: 16.1 g/dL (ref 13.0–17.0)
MCH: 31 pg (ref 26.0–34.0)
MCHC: 35.5 g/dL (ref 30.0–36.0)
MCV: 87.3 fL (ref 78.0–100.0)
PLATELETS: 227 10*3/uL (ref 150–400)
RBC: 5.19 MIL/uL (ref 4.22–5.81)
RDW: 13.1 % (ref 11.5–15.5)
WBC: 6.3 10*3/uL (ref 4.0–10.5)

## 2014-01-19 NOTE — Patient Instructions (Signed)
Dr. Vidyuth Belsito's General Advice Following Your Complete Physical Exam  The Benefits of Regular Exercise: Unless you suffer from an uncontrolled cardiovascular condition, studies strongly suggest that regular exercise and physical activity will add to both the quality and length of your life.  The World Health Organization recommends 150 minutes of moderate intensity aerobic activity every week.  This is best split over 3-4 days a week, and can be as simple as a brisk walk for just over 35 minutes "most days of the week".  This type of exercise has been shown to lower LDL-Cholesterol, lower average blood sugars, lower blood pressure, lower cardiovascular disease risk, improve memory, and increase one's overall sense of wellbeing.  The addition of anaerobic (or "strength training") exercises offers additional benefits including but not limited to increased metabolism, prevention of osteoporosis, and improved overall cholesterol levels.  How Can I Strive For A Low-Fat Diet?: Current guidelines recommend that 25-35 percent of your daily energy (food) intake should come from fats.  One might ask how can this be achieved without having to dissect each meal on a daily basis?  Switch to skim or 1% milk instead of whole milk.  Focus on lean meats such as ground turkey, fresh fish, baked chicken, and lean cuts of beef as your source of dietary protein.  Limit saturated fat consumption to less than 10% of your daily caloric intake.  Limit trans fatty acid consumption primarily by limiting synthetic trans fats such as partially hydrogenated oils (Ex: fried fast foods).  Substitute olive or vegetable oil for solid fats where possible.  Moderation of Salt Intake: Provided you don't carry a diagnosis of congestive heart failure nor renal failure, I recommend a daily allowance of no more than 2300 mg of salt (sodium).  Keeping under this daily goal is associated with a decreased risk of cardiovascular events, creeping  above it can lead to elevated blood pressures and increases your risk of cardiovascular events.  Milligrams (mg) of salt is listed on all nutrition labels, and your daily intake can add up faster than you think.  Most canned and frozen dinners can pack in over half your daily salt allowance in one meal.    Lifestyle Health Risks: Certain lifestyle choices carry specific health risks.  As you may already know, tobacco use has been associated with increasing one's risk of cardiovascular disease, pulmonary disease, numerous cancers, among many other issues.  What you may not know is that there are medications and nicotine replacement strategies that can more than double your chances of successfully quitting.  I would be thrilled to help manage your quitting strategy if you currently use tobacco products.  When it comes to alcohol use, I've yet to find an "ideal" daily allowance.  Provided an individual does not have a medical condition that is exacerbated by alcohol consumption, general guidelines determine "safe drinking" as no more than two standard drinks for a man or no more than one standard drink for a male per day.  However, much debate still exists on whether any amount of alcohol consumption is technically "safe".  My general advice, keep alcohol consumption to a minimum for general health promotion.  If you or others believe that alcohol, tobacco, or recreational drug use is interfering with your life, I would be happy to provide confidential counseling regarding treatment options.  General "Over The Counter" Nutrition Advice: Postmenopausal women should aim for a daily calcium intake of 1200 mg, however a significant portion of this might already be   provided by diets including milk, yogurt, cheese, and other dairy products.  Vitamin D has been shown to help preserve bone density, prevent fatigue, and has even been shown to help reduce falls in the elderly.  Ensuring a daily intake of 800 Units of  Vitamin D is a good place to start to enjoy the above benefits, we can easily check your Vitamin D level to see if you'd potentially benefit from supplementation beyond 800 Units a day.  Folic Acid intake should be of particular concern to women of childbearing age.  Daily consumption of 400-800 mcg of Folic Acid is recommended to minimize the chance of spinal cord defects in a fetus should pregnancy occur.    For many adults, accidents still remain one of the most common culprits when it comes to cause of death.  Some of the simplest but most effective preventitive habits you can adopt include regular seatbelt use, proper helmet use, securing firearms, and regularly testing your smoke and carbon monoxide detectors.  Santiel Topper B. Lilton Pare DO [facility] [address], [address] [place], [place] [address] Phone: [phone]  

## 2014-01-19 NOTE — Progress Notes (Signed)
CC: Micheal Hines is a 38 y.o. male is here for Annual Exam   Subjective: HPI:  CC: Micheal Hines is a 38 y.o. male is here for Annual Exam   Subjective: HPI:  Shares the news that his first child is due to be born in November  Colonoscopy: No family history of colon cancer will begin screening at age 32 Prostate: Discussed screening risks/beneifts with patient during today's visit, although he is a Caucasian his grandfather had prostate cancer we will anticipate PSA at age 39  Influenza Vaccine: He will receive this today Pneumovax: No current indication Td/Tdap: Tdap 2012 therefore up-to-date Zoster: (Start 39 yo)  No alcohol, tobacco no recreational drug use. No formal exercise routine.  Depression screening negative, no need for immediate intervention/followup, will address annually or sooner if indicated.   Review of Systems - General ROS: negative for - chills, fever, night sweats, weight gain or weight loss Ophthalmic ROS: negative for - decreased vision Psychological ROS: negative for - anxiety or depression ENT ROS: negative for - hearing change, nasal congestion, tinnitus or allergies Hematological and Lymphatic ROS: negative for - bleeding problems, bruising or swollen lymph nodes Breast ROS: negative Respiratory ROS: no cough, shortness of breath, or wheezing Cardiovascular ROS: no chest pain or dyspnea on exertion Gastrointestinal ROS: no abdominal pain, change in bowel habits, or black or bloody stools Genito-Urinary ROS: negative for - genital discharge, genital ulcers, incontinence or abnormal bleeding from genitals Musculoskeletal ROS: negative for - joint pain or muscle pain other than chronic left wrist pain that occurs more he types on a keyboard Neurological ROS: negative for - headaches or memory loss Dermatological ROS: negative for lumps, mole changes, rash and skin lesion changes  Past Medical History  Diagnosis Date  . Hyperlipidemia     Past  Surgical History  Procedure Laterality Date  . Wisdom tooth extraction     Family History  Problem Relation Age of Onset  . Diabetes Father   . Hyperlipidemia Father   . Prostate cancer Maternal Grandfather   . Breast cancer Paternal Grandmother     History   Social History  . Marital Status: Married    Spouse Name: N/A    Number of Children: N/A  . Years of Education: N/A   Occupational History  . IT    Social History Main Topics  . Smoking status: Never Smoker   . Smokeless tobacco: Never Used  . Alcohol Use: Yes  . Drug Use: No  . Sexual Activity: Yes    Birth Control/ Protection: Pill   Other Topics Concern  . Not on file   Social History Narrative  . No narrative on file     Objective: BP 132/86  Pulse 96  Ht 5' 11.75" (1.822 m)  Wt 224 lb (101.606 kg)  BMI 30.61 kg/m2  General: No Acute Distress HEENT: Atraumatic, normocephalic, conjunctivae normal without scleral icterus.  No nasal discharge, hearing grossly intact, TMs with good landmarks bilaterally with no middle ear abnormalities, posterior pharynx clear without oral lesions. Neck: Supple, trachea midline, no cervical nor supraclavicular adenopathy. Pulmonary: Clear to auscultation bilaterally without wheezing, rhonchi, nor rales. Cardiac: Regular rate and rhythm.  No murmurs, rubs, nor gallops. No peripheral edema.  2+ peripheral pulses bilaterally. Abdomen: Bowel sounds normal.  No masses.  Non-tender without rebound.  Negative Murphy's sign. GU: Bilateral descended testes without inguinal hernia MSK: Grossly intact, no signs of weakness.  Full strength throughout upper and lower extremities.  Full ROM  in upper and lower extremities.  No midline spinal tenderness. Left Wrist pain is not reproduced with resisted wrist flexion/extension/abduction/abduction nor is there any overlying skin changes or swelling Neuro: Gait unremarkable, CN II-XII grossly intact.  C5-C6 Reflex 2/4 Bilaterally, L4 Reflex 2/4  Bilaterally.  Cerebellar function intact. Skin: No rashes. Psych: Alert and oriented to person/place/time.  Thought process normal. No anxiety/depression.  Assessment & Plan: Bonham was seen today for annual exam.  Diagnoses and associated orders for this visit:  Annual physical exam - CBC - COMPLETE METABOLIC PANEL WITH GFR - Lipid panel  Hyperlipidemia - Lipid panel    Healthy lifestyle interventions including but not limited to regular exercise, a healthy low fat diet, moderation of salt intake, the dangers of tobacco/alcohol/recreational drug use, nutrition supplementation, and accident avoidance were discussed with the patient and a handout was provided for future reference.  I encouraged him to wear his wrist immobilizer if at all possible while typing on a keyboard and while sleeping for the next month if no better return on an as-needed basis  Return in about 1 year (around 01/20/2015) for Physical.

## 2014-12-07 ENCOUNTER — Emergency Department
Admission: EM | Admit: 2014-12-07 | Discharge: 2014-12-07 | Disposition: A | Discharge status: Home / Self Care | Payer: BLUE CROSS/BLUE SHIELD | Source: Home / Self Care | Attending: Family Medicine | Admitting: Family Medicine

## 2014-12-07 ENCOUNTER — Encounter: Payer: Self-pay | Admitting: Emergency Medicine

## 2014-12-07 DIAGNOSIS — B349 Viral infection, unspecified: Secondary | ICD-10-CM

## 2014-12-07 LAB — POCT CBC W AUTO DIFF (K'VILLE URGENT CARE)

## 2014-12-07 NOTE — ED Notes (Signed)
Pt c/o fever (states max temp this am 103) body aches and cold sores in mouth. States this started x2 days ago.

## 2014-12-07 NOTE — Discharge Instructions (Signed)
Increase fluid intake.  Check temperature regularly.  May take Ibuprofen 200mg , 4 tabs every 8 hours with food.  If symptoms become significantly worse during the night or over the weekend, proceed to the local emergency room.

## 2014-12-07 NOTE — ED Provider Notes (Signed)
CSN: [id number]     Arrival date & time 12/07/14  1420 History   First MD Initiated Contact with Patient 12/07/14 1529     Chief Complaint  Patient presents with  . Fever    HPI Comments: Patient developed fatigue and myalgias 48 hours ago.  Yesterday he developed chills and fever as high as 103.1, and multiple small "canker sores" in his mouth.  No sore throat or URI symptoms.  No rash.  The history is provided by the patient.    Past Medical History  Diagnosis Date  . Hyperlipidemia    Past Surgical History  Procedure Laterality Date  . Wisdom tooth extraction     Family History  Problem Relation Age of Onset  . Diabetes Father   . Hyperlipidemia Father   . Prostate cancer Maternal Grandfather   . Breast cancer Paternal Grandmother    History  Substance Use Topics  . Smoking status: Never Smoker   . Smokeless tobacco: Never Used  . Alcohol Use: Yes    Review of Systems No sore throat No cough No pleuritic pain No wheezing No nasal congestion No post-nasal drainage No sinus pain/pressure No itchy/red eyes No earache No hemoptysis No SOB + fever, + chills No nausea No vomiting No abdominal pain No diarrhea No urinary symptoms + skin rash + fatigue + myalgias No headache Used OTC meds without relief  Allergies  Review of patient's allergies indicates no known allergies.  Home Medications   Prior to Admission medications   Medication Sig Start Date End Date Taking? Authorizing Provider  Multiple Vitamin (MULTIVITAMIN) capsule Take 1 capsule by mouth daily.    Historical Provider, MD   BP 128/84 mmHg  Pulse 108  Temp(Src) 100.8 F (38.2 C) (Oral)  SpO2 99% Physical Exam Nursing notes and Vital Signs reviewed. Appearance:  Patient appears stated age, and in no acute distress Eyes:  Pupils are equal, round, and reactive to light and accomodation.  Extraocular movement is intact.  Conjunctivae are not inflamed  Ears:  Canals normal.  Tympanic  membranes normal.  Nose:   Normal turbinates.  No sinus tenderness.   Mouth:  Multiple small aphthous like lesions on buccal mucosae, tongue, gingiva, and mucosal surface of lips. Pharynx:  Normal  Neck:  Supple.   Posterior nodes tender to palpation. Lungs:  Clear to auscultation.  Breath sounds are equal.  Moving air well. Heart:  Regular rate and rhythm without murmurs, rubs, or gallops.  Abdomen:  Nontender without masses or hepatosplenomegaly.  Bowel sounds are present.  No CVA or flank tenderness.  Extremities:  No edema.  No calf tenderness Skin:  No rash present.   ED Course  Procedures  None    Labs Reviewed  POCT CBC W AUTO DIFF ([facility]):  WBC 4.4; LY 17.6; MO 5.5; GR 76.9; Hgb 15.3; Platelets 176       MDM   1. Acute viral syndrome; suspect hand, foot, mouth disease (with enanthem only)    Treat symptomatically for now  Increase fluid intake.  Check temperature regularly.  May take Ibuprofen 200mg , 4 tabs every 8 hours with food.  If symptoms become significantly worse during the night or over the weekend, proceed to the local emergency room.  Followup with Family Doctor if not improved in about 4 days.    Kandra Nicolas, MD 12/09/14 [phone]

## 2014-12-10 ENCOUNTER — Telehealth: Payer: Self-pay | Admitting: *Deleted

## 2015-02-05 ENCOUNTER — Encounter: Payer: Self-pay | Admitting: Family Medicine

## 2015-02-05 ENCOUNTER — Ambulatory Visit (INDEPENDENT_AMBULATORY_CARE_PROVIDER_SITE_OTHER): Payer: BLUE CROSS/BLUE SHIELD | Admitting: Family Medicine

## 2015-02-05 VITALS — BP 126/77 | HR 78 | Wt 226.0 lb

## 2015-02-05 DIAGNOSIS — R238 Other skin changes: Secondary | ICD-10-CM

## 2015-02-05 MED ORDER — GABAPENTIN 300 MG PO CAPS: ORAL_CAPSULE | ORAL | Status: DC

## 2015-02-05 MED ORDER — VALACYCLOVIR HCL 1 G PO TABS: 1000.0000 mg | ORAL_TABLET | Freq: Three times a day (TID) | ORAL | Status: DC

## 2015-02-05 NOTE — Progress Notes (Signed)
CC: Micheal Hines is a 39 y.o. male is here for Rash   Subjective: HPI:  Late last week patient began to have pain in his right flank. A day later he noticed a rash on the right flank. The rash is extremely tender to touch and the skin extending from the rash towards his belly button on the right side is also tender to touch but there has been no rash at the site. He's never had this before. Symptoms came on after a few weeks of very little sleep due to caring for his Micheal Hines who is teething and having repetitive upper respiratory illnesses. No intervention as of yet. Symptoms are moderate in severity all hours of the day. He denies fevers, chills, abdominal pain, nor any genitourinary complaints.   Review Of Systems Outlined In HPI  Past Medical History  Diagnosis Date  . Hyperlipidemia     Past Surgical History  Procedure Laterality Date  . Wisdom tooth extraction     Family History  Problem Relation Age of Onset  . Diabetes Micheal Hines   . Hyperlipidemia Micheal Hines   . Prostate cancer Maternal Micheal Hines   . Breast cancer Paternal Micheal Hines     Social History   Social History  . Marital Status: Married    Spouse Name: N/A  . Number of Children: N/A  . Years of Education: N/A   Occupational History  . IT    Social History Main Topics  . Smoking status: Never Smoker   . Smokeless tobacco: Never Used  . Alcohol Use: Yes  . Drug Use: No  . Sexual Activity: Yes    Birth Control/ Protection: Pill   Other Topics Concern  . Not on file   Social History Narrative     Objective: BP 126/77 mmHg  Pulse 78  Wt 226 lb (102.513 kg)  Vital signs reviewed. General: Alert and Oriented, No Acute Distress HEENT: Pupils equal, round, reactive to light. Conjunctivae clear.  External ears unremarkable.  Moist mucous membranes. Lungs: Clear and comfortable work of breathing, speaking in full sentences without accessory muscle use. Cardiac: Regular rate and rhythm.  Neuro: CN II-XII  grossly intact, gait normal. Extremities: No peripheral edema.  Strong peripheral pulses.  Mental Status: No depression, anxiety, nor agitation. Logical though process. Skin: Warm and dry. 3 cm patch of clear fluid filled vesicles all of which are no more than 2-3 mm in diameter, these sit on a bed of erythema and are tender to the touch.  Assessment & Plan: Micheal Hines was seen today for rash.  Diagnoses and all orders for this visit:  Vesicles -     gabapentin (NEURONTIN) 300 MG capsule; One by mouth at bedtime to help reduce pain from rash, may take additional dose twice in the daytime if tolerated. -     valACYclovir (VALTREX) 1000 MG tablet; Take 1 tablet (1,000 mg total) by mouth 3 (three) times daily. Seven days. -     Viral culture   One of the vesicles was unroofed with a sterile 23-gauge needle and the fluid was collected for a viral culture. We will use this to determine whether or not he should consider pain for a shingles vaccine out of pocket before he turns 62. On physical suspicion for shingles as high therefore start gabapentin and valacyclovir.    Return if symptoms worsen or fail to improve.

## 2015-02-07 ENCOUNTER — Encounter: Payer: Self-pay | Admitting: Osteopathic Medicine

## 2015-02-07 ENCOUNTER — Ambulatory Visit (INDEPENDENT_AMBULATORY_CARE_PROVIDER_SITE_OTHER): Payer: BLUE CROSS/BLUE SHIELD | Admitting: Osteopathic Medicine

## 2015-02-07 ENCOUNTER — Ambulatory Visit: Payer: BLUE CROSS/BLUE SHIELD | Admitting: Family Medicine

## 2015-02-07 ENCOUNTER — Ambulatory Visit (INDEPENDENT_AMBULATORY_CARE_PROVIDER_SITE_OTHER): Payer: BLUE CROSS/BLUE SHIELD | Admitting: Family Medicine

## 2015-02-07 ENCOUNTER — Encounter: Payer: Self-pay | Admitting: Family Medicine

## 2015-02-07 VITALS — BP 129/87 | HR 90 | Temp 98.2°F | Wt 227.0 lb

## 2015-02-07 DIAGNOSIS — M79671 Pain in right foot: Secondary | ICD-10-CM

## 2015-02-07 DIAGNOSIS — R238 Other skin changes: Secondary | ICD-10-CM | POA: Diagnosis not present

## 2015-02-07 DIAGNOSIS — Z0289 Encounter for other administrative examinations: Secondary | ICD-10-CM

## 2015-02-07 DIAGNOSIS — G4730 Sleep apnea, unspecified: Secondary | ICD-10-CM

## 2015-02-07 NOTE — Progress Notes (Signed)
CC: Micheal Hines is a 39 y.o. male is here for No chief complaint on file.   Subjective: HPI:  SHINGLES - rash about the same. Concern with pain medication for sleepiness side effect. Compliant with antivirals.   R foot . Location: R ball of foot . Quality: sharp pain . Duration: few weeks . Context: walking barefoot causes pain on ball of the foot . Modifying factors: only happens with weight bearing.   Sleep apnea. Has had this several years, terrible snoring, wife reports stopping breathing and gasping for air. No sleep study done before.    Review Of Systems Outlined In HPI  Past Medical History  Diagnosis Date  . Hyperlipidemia     Past Surgical History  Procedure Laterality Date  . Wisdom tooth extraction     Family History  Problem Relation Age of Onset  . Diabetes Father   . Hyperlipidemia Father   . Prostate cancer Maternal Grandfather   . Breast cancer Paternal Grandmother     Social History   Social History  . Marital Status: Married    Spouse Name: N/A  . Number of Children: N/A  . Years of Education: N/A   Occupational History  . IT    Social History Main Topics  . Smoking status: Never Smoker   . Smokeless tobacco: Never Used  . Alcohol Use: Yes  . Drug Use: No  . Sexual Activity: Yes    Birth Control/ Protection: Pill   Other Topics Concern  . Not on file   Social History Narrative     Objective: There were no vitals taken for this visit. General: Alert and Oriented, No Acute Distress Lungs: Clear and comfortable work of breathing, speaking in full sentences without accessory muscle use. Cardiac: Regular rate and rhythm.  Skin: Warm and dry. 3 cm patch of clear fluid filled vesicles all of which are no more than 2-3 mm in diameter, these sit on a bed of erythema and are tender to the touch.    Assessment & Plan:  Micheal Hines was seen today for rash, sleep apnea and foot pain.  Vesicles - C/W shingles, culture pending At last  visit, ne of the vesicles was collected for a viral culture, Dr Ileene Rubens started gabapentin and valacyclovir. Culture results still pending.   Sleep apnea syndrome - Ambulatory referral to Sleep Studies  Right foot pain - DG Foot Complete Right; Future Sports med referral if no improvement.

## 2015-02-07 NOTE — Patient Instructions (Signed)
Please call clinic for appointment with sports medicine doctors if your foot pain is not doing better in the next month or so. We will call with results of x-ray. May consider physical therapy for orthotics.  If you would like a lower dose prescription of gabapentin called in, please let us know.  Please let us know if you have not heard about referral for sleep study within the next week.

## 2015-02-07 NOTE — Progress Notes (Signed)
Since he is not a repeat offender at being late I'm not going to charge him for a no show especially since he'll be seeing one of my colleagues later today.

## 2015-02-11 LAB — REFLEX ADENOVIRUS CULTURE

## 2015-02-11 LAB — RFX HSV/VARICELLA ZOSTER RAPID CULT

## 2015-02-14 LAB — VIRAL CULTURE VIRC

## 2015-02-14 LAB — CYTOMEGALOVIRUS CULTURE

## 2015-02-17 ENCOUNTER — Encounter: Payer: Self-pay | Admitting: Family Medicine

## 2015-02-17 DIAGNOSIS — R238 Other skin changes: Secondary | ICD-10-CM | POA: Insufficient documentation

## 2015-03-24 ENCOUNTER — Encounter: Payer: Self-pay | Admitting: *Deleted

## 2015-03-24 ENCOUNTER — Emergency Department
Admission: EM | Admit: 2015-03-24 | Discharge: 2015-03-24 | Disposition: A | Discharge status: Home / Self Care | Payer: BLUE CROSS/BLUE SHIELD | Source: Home / Self Care | Attending: Emergency Medicine | Admitting: Emergency Medicine

## 2015-03-24 DIAGNOSIS — H109 Unspecified conjunctivitis: Secondary | ICD-10-CM | POA: Diagnosis not present

## 2015-03-24 MED ORDER — TOBRAMYCIN 0.3 % OP SOLN: 2.0000 [drp] | OPHTHALMIC | Status: DC

## 2015-03-24 NOTE — ED Notes (Signed)
Pt states yesterday he started to have L eye pain, itching, and redness.  Started as a dry feeling with pain 3/10.  Woke up to eye crusted shut.

## 2015-03-24 NOTE — Discharge Instructions (Signed)

## 2015-03-25 ENCOUNTER — Ambulatory Visit (HOSPITAL_BASED_OUTPATIENT_CLINIC_OR_DEPARTMENT_OTHER): Payer: BLUE CROSS/BLUE SHIELD

## 2015-03-25 NOTE — ED Provider Notes (Signed)
CSN: [id number]     Arrival date & time 03/24/15  1124 History   First MD Initiated Contact with Patient 03/24/15 1304     Chief Complaint  Patient presents with  . Eye Pain    L   (Consider location/radiation/quality/duration/timing/severity/associated sxs/prior Treatment) Patient is a 39 y.o. male presenting with eye pain. The history is provided by the patient. No language interpreter was used.  Eye Pain This is a new problem. The problem occurs constantly. The problem has been gradually worsening. Nothing aggravates the symptoms. Nothing relieves the symptoms. He has tried nothing for the symptoms. The treatment provided no relief.  Pt thinks he has pink eye.  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 He complains of redness and irritation.  Past Medical History  Diagnosis Date  . Hyperlipidemia    Past Surgical History  Procedure Laterality Date  . Wisdom tooth extraction     Family History  Problem Relation Age of Onset  . Diabetes Father   . Hyperlipidemia Father   . Prostate cancer Maternal Grandfather   . Breast cancer Paternal Grandmother    Social History  Substance Use Topics  . Smoking status: Never Smoker   . Smokeless tobacco: Never Used  . Alcohol Use: Yes    Review of Systems  Eyes: Positive for discharge and redness. Negative for photophobia, pain and visual disturbance.  All other systems reviewed and are negative.   Allergies  Review of patient's allergies indicates no known allergies.  Home Medications   Prior to Admission medications   Medication Sig Start Date End Date Taking? Authorizing Provider  gabapentin (NEURONTIN) 300 MG capsule One by mouth at bedtime to help reduce pain from rash, may take additional dose twice in the daytime if tolerated. 02/05/15   Marcial Pacas, DO  Multiple Vitamin (MULTIVITAMIN) capsule Take 1 capsule by mouth daily.    Historical Provider, MD  tobramycin (TOBREX) 0.3 % ophthalmic solution Place 2 drops into the left eye every 4  (four) hours. 03/24/15   Fransico Meadow, PA-C  valACYclovir (VALTREX) 1000 MG tablet Take 1 tablet (1,000 mg total) by mouth 3 (three) times daily. Seven days. 02/05/15   Marcial Pacas, DO   Meds Ordered and Administered this Visit  Medications - No data to display  BP 119/84 mmHg  Pulse 82  Temp(Src) 98 F (36.7 C) (Oral)  Ht 6' (1.829 m)  Wt 219 lb 8 oz (99.565 kg)  BMI 29.76 kg/m2  SpO2 99% No data found.   Physical Exam  Constitutional: He is oriented to person, place, and time. He appears well-developed and well-nourished.  HENT:  Head: Normocephalic.  Right Ear: External ear normal.  Eyes: EOM are normal. Pupils are equal, round, and reactive to light. Right eye exhibits discharge.  Injected left conjunctiva  Neck: Normal range of motion.  Cardiovascular: Normal rate.   Pulmonary/Chest: Effort normal.  Abdominal: He exhibits no distension.  Musculoskeletal: Normal range of motion.  Neurological: He is alert and oriented to person, place, and time.  Psychiatric: He has a normal mood and affect.  Nursing note and vitals reviewed.   ED Course  Procedures (including critical care time)  Labs Review Labs Reviewed - No data to display  Imaging Review No results found.   Visual Acuity Review  Right Eye Distance:   Left Eye Distance:   Bilateral Distance:    Right Eye Near:   Left Eye Near:    Bilateral Near:  MDM history   and physical consistent with simple conjunctivitis.    1. Conjunctivitis of left eye    An After Visit Summary was printed and given to the patient. Meds ordered this encounter  Medications  . tobramycin (TOBREX) 0.3 % ophthalmic solution    Sig: Place 2 drops into the left eye every 4 (four) hours.    Dispense:  5 mL    Refill:  0    Order Specific Question:  Supervising Provider    Answer:  Burnett Harry, DAVID [[id number]]   An After Visit Summary was printed and given to the patient.  [place], PA-C 03/25/15 1438

## 2015-04-06 ENCOUNTER — Encounter: Payer: Self-pay | Admitting: Emergency Medicine

## 2015-04-06 ENCOUNTER — Emergency Department
Admission: EM | Admit: 2015-04-06 | Discharge: 2015-04-06 | Disposition: A | Discharge status: Home / Self Care | Payer: BLUE CROSS/BLUE SHIELD | Source: Home / Self Care | Attending: Family Medicine | Admitting: Family Medicine

## 2015-04-06 DIAGNOSIS — J029 Acute pharyngitis, unspecified: Secondary | ICD-10-CM

## 2015-04-06 DIAGNOSIS — J0100 Acute maxillary sinusitis, unspecified: Secondary | ICD-10-CM | POA: Diagnosis not present

## 2015-04-06 DIAGNOSIS — J069 Acute upper respiratory infection, unspecified: Secondary | ICD-10-CM

## 2015-04-06 HISTORY — DX: Zoster without complications: B02.9

## 2015-04-06 MED ORDER — AMOXICILLIN-POT CLAVULANATE 875-125 MG PO TABS: 1.0000 | ORAL_TABLET | Freq: Two times a day (BID) | ORAL | Status: DC

## 2015-04-06 MED ORDER — IBUPROFEN 600 MG PO TABS: 600.0000 mg | ORAL_TABLET | Freq: Four times a day (QID) | ORAL | Status: DC | PRN

## 2015-04-06 MED ORDER — BENZONATATE 100 MG PO CAPS: 100.0000 mg | ORAL_CAPSULE | Freq: Three times a day (TID) | ORAL | Status: DC

## 2015-04-06 NOTE — ED Provider Notes (Signed)
CSN: [id number]     Arrival date & time 04/06/15  1120 History   First MD Initiated Contact with Patient 04/06/15 1132     Chief Complaint  Patient presents with  . Sore Throat  . Hoarse   (Consider location/radiation/quality/duration/timing/severity/associated sxs/prior Treatment) HPI Pt is a 39yo male presenting to [facility] with c/o gradually worsening cough, congestion, sinus pressure and sore throat for 1 week but has had intermittent cough and congestion for 2-3 weeks.  Pt states he had a stomach virus about 3 weeks ago as well as pink eye that followed. Pt states eye symptoms and GI symptoms have since resolved but sore throat and nasal congestion are most concerning for pt now. He has tried OTC cough medication yesterday but no relief of throat pain. Pt states he feels like his throat is swelling at times. Denies fever or chills. Pt states his 51yr is in daycare and is also sick often.   Past Medical History  Diagnosis Date  . Hyperlipidemia   . Shingles    Past Surgical History  Procedure Laterality Date  . Wisdom tooth extraction     Family History  Problem Relation Age of Onset  . Diabetes Father   . Hyperlipidemia Father   . Prostate cancer Maternal Grandfather   . Breast cancer Paternal Grandmother    Social History  Substance Use Topics  . Smoking status: Never Smoker   . Smokeless tobacco: Never Used  . Alcohol Use: Yes    Review of Systems  Constitutional: Negative for fever and chills.  HENT: Positive for congestion, rhinorrhea, sinus pressure, sore throat and voice change. Negative for ear pain and trouble swallowing.   Respiratory: Positive for cough and wheezing. Negative for shortness of breath.   Cardiovascular: Negative for chest pain and palpitations.  Gastrointestinal: Negative for nausea, vomiting, abdominal pain and diarrhea.  Musculoskeletal: Negative for myalgias, back pain and arthralgias.  Skin: Negative for rash.    Allergies  Review of patient's  allergies indicates no known allergies.  Home Medications   Prior to Admission medications   Medication Sig Start Date End Date Taking? Authorizing Provider  amoxicillin-clavulanate (AUGMENTIN) 875-125 MG tablet Take 1 tablet by mouth 2 (two) times daily. One po bid x 7 days 04/06/15   Noland Fordyce, PA-C  benzonatate (TESSALON) 100 MG capsule Take 1 capsule (100 mg total) by mouth every 8 (eight) hours. 04/06/15   Noland Fordyce, PA-C  gabapentin (NEURONTIN) 300 MG capsule One by mouth at bedtime to help reduce pain from rash, may take additional dose twice in the daytime if tolerated. 02/05/15   Sean Hommel, DO  ibuprofen (ADVIL,MOTRIN) 600 MG tablet Take 1 tablet (600 mg total) by mouth every 6 (six) hours as needed. 04/06/15   Noland Fordyce, PA-C  Multiple Vitamin (MULTIVITAMIN) capsule Take 1 capsule by mouth daily.    Historical Provider, MD  tobramycin (TOBREX) 0.3 % ophthalmic solution Place 2 drops into the left eye every 4 (four) hours. 03/24/15   Fransico Meadow, PA-C  valACYclovir (VALTREX) 1000 MG tablet Take 1 tablet (1,000 mg total) by mouth 3 (three) times daily. Seven days. 02/05/15   Marcial Pacas, DO   Meds Ordered and Administered this Visit  Medications - No data to display  BP 117/77 mmHg  Pulse 96  Temp(Src) 98.2 F (36.8 C) (Oral)  Resp 16  Ht 6' (1.829 m)  Wt 221 lb (100.245 kg)  BMI 29.97 kg/m2  SpO2 98% No data found.   Physical  Exam  Constitutional: He appears well-developed and well-nourished.  HENT:  Head: Normocephalic and atraumatic.  Right Ear: Hearing, tympanic membrane, external ear and ear canal normal.  Left Ear: Hearing, tympanic membrane, external ear and ear canal normal.  Nose: Mucosal edema and rhinorrhea present. Right sinus exhibits maxillary sinus tenderness. Right sinus exhibits no frontal sinus tenderness. Left sinus exhibits maxillary sinus tenderness and frontal sinus tenderness.  Mouth/Throat: Uvula is midline and mucous membranes are  normal. Posterior oropharyngeal erythema present. No oropharyngeal exudate, posterior oropharyngeal edema or tonsillar abscesses.  Eyes: Conjunctivae are normal. No scleral icterus.  Neck: Normal range of motion. Neck supple.  Cardiovascular: Normal rate, regular rhythm and normal heart sounds.   Pulmonary/Chest: Effort normal and breath sounds normal. No respiratory distress. He has no wheezes. He has no rales. He exhibits no tenderness.  Abdominal: Soft. He exhibits no distension and no mass. There is no tenderness. There is no rebound and no guarding.  Musculoskeletal: Normal range of motion.  Lymphadenopathy:    He has cervical adenopathy.  Neurological: He is alert.  Skin: Skin is warm and dry.  Nursing note and vitals reviewed.   ED Course  Procedures (including critical care time)  Labs Review Labs Reviewed - No data to display  Imaging Review No results found.    MDM   1. Acute maxillary sinusitis, recurrence not specified   2. Acute pharyngitis, unspecified etiology   3. Acute upper respiratory infection    Pt c/o sinus congestion with sinus pressure, sore throat and cough for 2-3 weeks, worse over the last 1 week.  Pt does have nasal mucosa edema as well as facial tenderness c/w sinusitis.   Rx: augmentin, tessalon and ibuprofen.  Advised pt to use acetaminophen and ibuprofen as needed for fever and pain. Encouraged rest and fluids. F/u with PCP in 7-10 days if not improving, sooner if worsening. Pt verbalized understanding and agreement with tx plan.    Noland Fordyce, PA-C 04/06/15 1247

## 2015-04-06 NOTE — ED Notes (Signed)
Patient reports 3 week history of GI virus; then pink eye; and URI over past week leaving him with sore throat and cough.No OTCs today.

## 2015-04-06 NOTE — Discharge Instructions (Signed)

## 2015-05-05 DIAGNOSIS — G4730 Sleep apnea, unspecified: Secondary | ICD-10-CM

## 2015-05-05 HISTORY — DX: Sleep apnea, unspecified: G47.30

## 2015-05-10 ENCOUNTER — Emergency Department (INDEPENDENT_AMBULATORY_CARE_PROVIDER_SITE_OTHER)
Admission: EM | Admit: 2015-05-10 | Discharge: 2015-05-10 | Disposition: A | Discharge status: Home / Self Care | Payer: BLUE CROSS/BLUE SHIELD | Source: Home / Self Care | Attending: Family Medicine | Admitting: Family Medicine

## 2015-05-10 ENCOUNTER — Encounter: Payer: Self-pay | Admitting: *Deleted

## 2015-05-10 DIAGNOSIS — J029 Acute pharyngitis, unspecified: Secondary | ICD-10-CM

## 2015-05-10 DIAGNOSIS — K122 Cellulitis and abscess of mouth: Secondary | ICD-10-CM | POA: Diagnosis not present

## 2015-05-10 MED ORDER — CEFDINIR 300 MG PO CAPS: 300.0000 mg | ORAL_CAPSULE | Freq: Two times a day (BID) | ORAL | Status: DC

## 2015-05-10 MED ORDER — PREDNISONE 20 MG PO TABS: 20.0000 mg | ORAL_TABLET | Freq: Two times a day (BID) | ORAL | Status: DC

## 2015-05-10 MED ORDER — BENZONATATE 200 MG PO CAPS: 200.0000 mg | ORAL_CAPSULE | Freq: Every day | ORAL | Status: DC

## 2015-05-10 MED ORDER — METHYLPREDNISOLONE SODIUM SUCC 125 MG IJ SOLR
80.0000 mg | Freq: Once | INTRAMUSCULAR | Status: AC
  Administered 2015-05-10: 80 mg via INTRAMUSCULAR

## 2015-05-10 NOTE — Discharge Instructions (Signed)
Begin prednisone Saturday, 05/11/15. Take plain guaifenesin (1200mg  extended release tabs such as Mucinex) twice daily, with plenty of water, for cough and congestion.  May add Pseudoephedrine (30mg , one or two every 4 to 6 hours) for sinus congestion.  Get adequate rest.   May use Afrin nasal spray (or generic oxymetazoline) twice daily for about 5 days and then discontinue.  Also recommend using saline nasal spray several times daily and saline nasal irrigation (AYR is a common brand).  Use Flonase nasal spray each morning after using Afrin nasal spray and saline nasal irrigation. Stop all antihistamines for now, and other non-prescription cough/cold preparations. If symptoms become significantly worse during the night or over the weekend, proceed to the local emergency room.

## 2015-05-10 NOTE — ED Provider Notes (Signed)
CSN: [id number]     Arrival date & time 05/10/15  1845 History   First MD Initiated Contact with Patient 05/10/15 1924     Chief Complaint  Patient presents with  . Oral Swelling      HPI Comments: Patient reports that he awoke 4am this morning with his uvula swollen, and a sensation of a lump in his throat.  He feels well otherwise, and denies difficulty breathing and no fevers, chills, and sweats.  About one week ago he had mild sinus congestion and cough, now resolving.  The history is provided by the patient.    Past Medical History  Diagnosis Date  . Hyperlipidemia   . Shingles    Past Surgical History  Procedure Laterality Date  . Wisdom tooth extraction     Family History  Problem Relation Age of Onset  . Diabetes Father   . Hyperlipidemia Father   . Prostate cancer Maternal Grandfather   . Breast cancer Paternal Grandmother    Social History  Substance Use Topics  . Smoking status: Never Smoker   . Smokeless tobacco: Never Used  . Alcohol Use: Yes    Review of Systems + sore throat + cough No pleuritic pain No wheezing + nasal congestion ? post-nasal drainage No sinus pain/pressure No itchy/red eyes No earache No hemoptysis No SOB No fever/ chills No nausea No vomiting No abdominal pain No diarrhea No urinary symptoms No skin rash No fatigue No myalgias No headache Used OTC meds without relief  Allergies  Review of patient's allergies indicates no known allergies.  Home Medications   Prior to Admission medications   Medication Sig Start Date End Date Taking? Authorizing Provider  benzonatate (TESSALON) 200 MG capsule Take 1 capsule (200 mg total) by mouth at bedtime. Take as needed for cough 05/10/15   Kandra Nicolas, MD  cefdinir (OMNICEF) 300 MG capsule Take 1 capsule (300 mg total) by mouth 2 (two) times daily. 05/10/15   Kandra Nicolas, MD  ibuprofen (ADVIL,MOTRIN) 600 MG tablet Take 1 tablet (600 mg total) by mouth every 6 (six) hours as  needed. 04/06/15   Noland Fordyce, PA-C  Multiple Vitamin (MULTIVITAMIN) capsule Take 1 capsule by mouth daily.    Historical Provider, MD  predniSONE (DELTASONE) 20 MG tablet Take 1 tablet (20 mg total) by mouth 2 (two) times daily. Take with food. 05/10/15   Kandra Nicolas, MD   Meds Ordered and Administered this Visit   Medications  methylPREDNISolone sodium succinate (SOLU-MEDROL) 125 mg/2 mL injection 80 mg (80 mg Intramuscular Given 05/10/15 1946)    BP 149/85 mmHg  Pulse 73  Temp(Src) 98.3 F (36.8 C) (Oral)  Resp 16  Wt 227 lb (102.967 kg)  SpO2 98% No data found.   Physical Exam Nursing notes and Vital Signs reviewed. Appearance:  Patient appears stated age, and in no acute distress Eyes:  Pupils are equal, round, and reactive to light and accomodation.  Extraocular movement is intact.  Conjunctivae are not inflamed  Ears:  Canals normal.  Tympanic membranes normal.  Nose:  Congested turbinates.  No sinus tenderness.    Pharynx:  Uvula is mildly swollen and erythematous Neck:  Supple.  Tender enlarged posterior nodes are palpated bilaterally.  No thyromegaly  Lungs:  Clear to auscultation.  Breath sounds are equal.  Moving air well. Heart:  Regular rate and rhythm without murmurs, rubs, or gallops.  Abdomen:  Nontender without masses or hepatosplenomegaly.  Bowel sounds are present.  No CVA or flank tenderness.  Extremities:  No edema.  Skin:  No rash present.   ED Course  Procedures none   MDM   1. Uvulitis   2. Acute pharyngitis, unspecified etiology     Administered Solumedrol 80mg  IM. Begin Omnicef 300mg  BID.  Rx for prednisone burst. Begin prednisone Saturday, 05/11/15. Take plain guaifenesin (1200mg  extended release tabs such as Mucinex) twice daily, with plenty of water, for cough and congestion.  May add Pseudoephedrine (30mg , one or two every 4 to 6 hours) for sinus congestion.  Get adequate rest.   May use Afrin nasal spray (or generic oxymetazoline) twice  daily for about 5 days and then discontinue.  Also recommend using saline nasal spray several times daily and saline nasal irrigation (AYR is a common brand).  Use Flonase nasal spray each morning after using Afrin nasal spray and saline nasal irrigation. Stop all antihistamines for now, and other non-prescription cough/cold preparations. If symptoms become significantly worse during the night or over the weekend, proceed to the local emergency room.  Followup with Family Doctor if not improved in about 5 days.     Kandra Nicolas, MD 05/14/15 [phone]

## 2015-05-10 NOTE — ED Notes (Signed)
Pt reports waking @ 4am with the feeling of a lump in his throat. His uvula is enlarged. He talks, breathes and swallows without difficulty, but c/o uncomfortable/pain. Recent cough/congestion

## 2015-06-07 ENCOUNTER — Ambulatory Visit (HOSPITAL_BASED_OUTPATIENT_CLINIC_OR_DEPARTMENT_OTHER): Payer: BC Managed Care – PPO | Attending: Osteopathic Medicine

## 2015-06-07 DIAGNOSIS — R0683 Snoring: Secondary | ICD-10-CM | POA: Diagnosis not present

## 2015-06-07 DIAGNOSIS — G4710 Hypersomnia, unspecified: Secondary | ICD-10-CM | POA: Diagnosis not present

## 2015-06-07 DIAGNOSIS — G4730 Sleep apnea, unspecified: Secondary | ICD-10-CM | POA: Diagnosis present

## 2015-06-07 DIAGNOSIS — G4733 Obstructive sleep apnea (adult) (pediatric): Secondary | ICD-10-CM | POA: Diagnosis not present

## 2015-06-07 DIAGNOSIS — I491 Atrial premature depolarization: Secondary | ICD-10-CM | POA: Insufficient documentation

## 2015-06-07 DIAGNOSIS — G4737 Central sleep apnea in conditions classified elsewhere: Secondary | ICD-10-CM | POA: Diagnosis not present

## 2015-06-09 DIAGNOSIS — G4733 Obstructive sleep apnea (adult) (pediatric): Secondary | ICD-10-CM

## 2015-06-09 NOTE — Progress Notes (Signed)
  Patient Name: Micheal Hines, Micheal Hines Date: 06/07/2015 Gender: Male D.O.B: 10/18/1975 Age (years): 39 Referring Provider: [name] Height (inches): 63 Interpreting Physician: Baird Lyons MD, ABSM Weight (lbs): 220 RPSGT: Jonna Coup BMI: 30 MRN: [id number] Neck Size: 15.50 CLINICAL INFORMATION Sleep Study Type: NPSG   Indication for sleep study: hypersomnia with sleep apnea   Epworth Sleepiness Score: 8   SLEEP STUDY TECHNIQUE As per the AASM Manual for the Scoring of Sleep and Associated Events v2.3 (April 2016) with a hypopnea requiring 4% desaturations. The channels recorded and monitored were frontal, central and occipital EEG, electrooculogram (EOG), submentalis EMG (chin), nasal and oral airflow, thoracic and abdominal wall motion, anterior tibialis EMG, snore microphone, electrocardiogram, and pulse oximetry.  MEDICATIONS Patient's medications include: charted for review. Medications self-administered by patient during sleep study : No sleep medicine administered.  SLEEP ARCHITECTURE The study was initiated at 9:48:26 PM and ended at 4:32:24 AM. Sleep onset time was 27.0 minutes and the sleep efficiency was 81.4%. The total sleep time was 329.0 minutes. Stage REM latency was 86.5 minutes. The patient spent 2.74% of the night in stage N1 sleep, 72.95% in stage N2 sleep, 8.21% in stage N3 and 16.11% in REM. Alpha intrusion was absent. Supine sleep was 18.77%. Wake after sleep onset 48 minutes  RESPIRATORY PARAMETERS The overall apnea/hypopnea index (AHI) was 20.2 per hour. There were 75 total apneas, including 41 obstructive, 34 central and 0 mixed apneas. There were 36 hypopneas and 54 RERAs. The AHI during Stage REM sleep was 26.0 per hour. AHI while supine was 58.3 per hour. The mean oxygen saturation was 95.97%. The minimum SpO2 during sleep was 88.00%. Loud snoring was noted during this study.  CARDIAC DATA The 2 lead EKG demonstrated sinus  rhythm. The mean heart rate was 59.60 beats per minute. Other EKG findings include: PACs.  LEG MOVEMENT DATA The total PLMS were 212 with a resulting PLMS index of 38.67. Associated arousal with leg movement index was 3.5 .  IMPRESSIONS - Moderate obstructive sleep apnea occurred during this study (AHI = 20.2/h). - Mild central sleep apnea occurred during this study (CAI = 6.2/h). - The patient had minimal or no oxygen desaturation during the study (Min O2 = 88.00%) - The patient snored with Loud snoring volume. - Moderate periodic limb movements of sleep occurred during the study. No significant associated arousals.  DIAGNOSIS - Obstructive Sleep Apnea (327.23 [G47.33 ICD-10]) - Central Sleep Apnea (327.27 [G47.37 ICD-10])  RECOMMENDATIONS - CPAP titration to determine optimal pressure required to alleviate sleep disordered breathing. Central apneas will probably not be clinically significant.. - Positional therapy avoiding supine position during sleep. - Avoid alcohol, sedatives and other CNS depressants that may worsen sleep apnea and disrupt normal sleep architecture. - Sleep hygiene should be reviewed to assess factors that may improve sleep quality. - Weight management and regular exercise should be initiated or continued if appropriate. - If limb movement remains a cause of sleep disturbance then it may need to be treated, as with Requip or Klonopin, based on further assessment.  Deneise Lever Diplomate, American Board of Sleep Medicine  ELECTRONICALLY SIGNED ON:  06/09/2015, 9:43 AM [place] PH: [phone]   FX: [phone] [place]

## 2015-06-27 ENCOUNTER — Telehealth: Payer: Self-pay

## 2015-06-27 DIAGNOSIS — G4733 Obstructive sleep apnea (adult) (pediatric): Secondary | ICD-10-CM

## 2015-06-27 NOTE — Telephone Encounter (Signed)
Pt called wanting his sleep study results.  Please advise.

## 2015-06-28 DIAGNOSIS — G4733 Obstructive sleep apnea (adult) (pediatric): Secondary | ICD-10-CM | POA: Insufficient documentation

## 2015-06-28 NOTE — Telephone Encounter (Signed)
Will you please let patient know that the results were sent to Dr. Sheppard Coil.  I'm not sure why she hasn't notified him that the sleep study confirmed that he has obstructive sleep apnea and the next step would be to get a study called a CPAP titration to determine what pressure is needed with a CPAP device to treat his sleep apnea.  A referral has been sent to the  sleep center.

## 2015-06-28 NOTE — Telephone Encounter (Signed)
Pt.notified

## 2015-07-06 ENCOUNTER — Ambulatory Visit (HOSPITAL_BASED_OUTPATIENT_CLINIC_OR_DEPARTMENT_OTHER): Payer: BC Managed Care – PPO | Attending: Family Medicine

## 2015-07-06 VITALS — Ht 72.0 in | Wt 220.0 lb

## 2015-07-06 DIAGNOSIS — R0683 Snoring: Secondary | ICD-10-CM | POA: Diagnosis not present

## 2015-07-06 DIAGNOSIS — G4733 Obstructive sleep apnea (adult) (pediatric): Secondary | ICD-10-CM | POA: Diagnosis not present

## 2015-07-06 DIAGNOSIS — G4730 Sleep apnea, unspecified: Secondary | ICD-10-CM | POA: Diagnosis present

## 2015-07-07 DIAGNOSIS — G4733 Obstructive sleep apnea (adult) (pediatric): Secondary | ICD-10-CM | POA: Diagnosis not present

## 2015-07-07 NOTE — Progress Notes (Signed)
  Patient Name: Micheal Hines, Micheal Hines Date: 07/06/2015 Gender: Male D.O.B: 03/23/1976 Age (years): 39 Referring Provider: [name] Height (inches): 72 Interpreting Physician: Baird Lyons MD, ABSM Weight (lbs): 220 RPSGT: Madelon Lips BMI: 30 MRN: [id number] Neck Size: 15.50 CLINICAL INFORMATION The patient is referred for a CPAP titration to treat sleep apnea.   Date of  NPSG, Split Night or HST:  Diagnostic NPSG 06/07/15 AHI 20.2/ hr, desaturation to 88%, body weight 220 lbs  SLEEP STUDY TECHNIQUE As per the AASM Manual for the Scoring of Sleep and Associated Events v2.3 (April 2016) with a hypopnea requiring 4% desaturations. The channels recorded and monitored were frontal, central and occipital EEG, electrooculogram (EOG), submentalis EMG (chin), nasal and oral airflow, thoracic and abdominal wall motion, anterior tibialis EMG, snore microphone, electrocardiogram, and pulse oximetry. Continuous positive airway pressure (CPAP) was initiated at the beginning of the study and titrated to treat sleep-disordered breathing.  MEDICATIONS Medications taken by the patient : charted for review Medications administered by patient during sleep study : No sleep medicine administered.  TECHNICIAN COMMENTS Comments added by technician: PATIENT TOLERATED CPAP WITHOUT DIFFICULTY  Comments added by scorer: N/A  RESPIRATORY PARAMETERS Optimal PAP Pressure (cm): 8 AHI at Optimal Pressure (/hr): 0.0 Overall Minimal O2 (%): 94.00 Supine % at Optimal Pressure (%): 77 Minimal O2 at Optimal Pressure (%): 94.0    SLEEP ARCHITECTURE The study was initiated at 10:37:19 PM and ended at 4:42:20 AM. Sleep onset time was 15.0 minutes and the sleep efficiency was 87.4%. The total sleep time was 319.0 minutes. The patient spent 8.46% of the night in stage N1 sleep, 50.63% in stage N2 sleep, 7.84% in stage N3 and 33.07% in REM.Stage REM latency was 73.0 minutes Wake after sleep onset was 31.1  minutes. Alpha intrusion was absent. Supine sleep was 51.10%.  CARDIAC DATA The 2 lead EKG demonstrated sinus rhythm. The mean heart rate was 57.73 beats per minute. Other EKG findings include: None.  LEG MOVEMENT DATA The total Periodic Limb Movements of Sleep (PLMS) were 0. The PLMS index was 0.00. A PLMS index of <15 is considered normal in adults.  IMPRESSIONS - The optimal PAP pressure was 8 cm of water. - Central sleep apnea was not noted during this titration (CAI = 0.0/h). - Significant oxygen desaturations were not observed during this titration (min O2 = 94.00%). - The patient snored with Moderate snoring volume during this titration study. - No cardiac abnormalities were observed during this study. - Clinically significant periodic limb movements were not noted during this study. Arousals associated with PLMs were rare.  DIAGNOSIS - Obstructive Sleep Apnea (327.23 [G47.33 ICD-10])  RECOMMENDATIONS - Trial of CPAP therapy on 8 cm H2O with a Medium size Resmed Full Face Mask AirFit F20 mask and heated humidification. - Avoid alcohol, sedatives and other CNS depressants that may worsen sleep apnea and disrupt normal sleep architecture. - Sleep hygiene should be reviewed to assess factors that may improve sleep quality. - Weight management and regular exercise should be initiated or continued.  Deneise Lever Diplomate, American Board of Sleep Medicine  ELECTRONICALLY SIGNED ON:  07/07/2015, 11:15 AM [place] PH: [phone]   FX: [phone] [place]

## 2015-07-08 ENCOUNTER — Telehealth: Payer: Self-pay | Admitting: Family Medicine

## 2015-07-08 DIAGNOSIS — G4733 Obstructive sleep apnea (adult) (pediatric): Secondary | ICD-10-CM

## 2015-07-08 MED ORDER — AMBULATORY NON FORMULARY MEDICATION: Status: DC

## 2015-07-08 NOTE — Telephone Encounter (Signed)
Will you please let patient know that his sleep apnea was controlled with 8cm of water pressure, I've printed off a Rx for this, will you please contact a respiratory supply company that will accept his insurance.  Please f/u with me in 2-4 weeks after starting this new therapy.

## 2015-07-08 NOTE — Telephone Encounter (Signed)
Pt.notified

## 2015-08-11 ENCOUNTER — Emergency Department
Admission: EM | Admit: 2015-08-11 | Discharge: 2015-08-11 | Disposition: A | Discharge status: Home / Self Care | Payer: BC Managed Care – PPO | Source: Home / Self Care | Attending: Family Medicine | Admitting: Family Medicine

## 2015-08-11 DIAGNOSIS — J019 Acute sinusitis, unspecified: Secondary | ICD-10-CM | POA: Diagnosis not present

## 2015-08-11 DIAGNOSIS — H8113 Benign paroxysmal vertigo, bilateral: Secondary | ICD-10-CM | POA: Diagnosis not present

## 2015-08-11 MED ORDER — PREDNISONE 20 MG PO TABS: ORAL_TABLET | ORAL | Status: DC

## 2015-08-11 MED ORDER — DOXYCYCLINE HYCLATE 100 MG PO CAPS: 100.0000 mg | ORAL_CAPSULE | Freq: Two times a day (BID) | ORAL | Status: DC

## 2015-08-11 MED ORDER — DEXAMETHASONE SODIUM PHOSPHATE 10 MG/ML IJ SOLN
10.0000 mg | Freq: Once | INTRAMUSCULAR | Status: AC
  Administered 2015-08-11: 10 mg via INTRAMUSCULAR

## 2015-08-11 MED ORDER — MECLIZINE HCL 25 MG PO TABS: 25.0000 mg | ORAL_TABLET | Freq: Three times a day (TID) | ORAL | Status: DC | PRN

## 2015-08-11 NOTE — ED Notes (Signed)
Started with a cold 4 days ago, Friday, Saturday, and today has had a lot of dizziness.  When he stands, moves his head or any other movement, he said that his head feels "heavy".

## 2015-08-11 NOTE — ED Provider Notes (Signed)
CSN: [id number]     Arrival date & time 08/11/15  1258 History   First MD Initiated Contact with Patient 08/11/15 1315     Chief Complaint  Patient presents with  . Nasal Congestion  . Dizziness   (Consider location/radiation/quality/duration/timing/severity/associated sxs/prior Treatment) HPI The pt is a 40yo male presenting to [facility] with c/o 4 days of cold-like symptoms including sinus congestion, frontal headache and pressure with "heavy" feeling, and associated dizziness described as "room spinning."  He has been trying OTC medications but no relief. Pt notes his son came home sick from daycare and believes that is how he got sick. Reports hx of vertigo many years but does not recall the medication he took for that. Denies n/v/d. Denies fever or chills. Denies recent head injury.   Past Medical History  Diagnosis Date  . Hyperlipidemia   . Shingles    Past Surgical History  Procedure Laterality Date  . Wisdom tooth extraction     Family History  Problem Relation Age of Onset  . Diabetes Father   . Hyperlipidemia Father   . Prostate cancer Maternal Grandfather   . Breast cancer Paternal Grandmother    Social History  Substance Use Topics  . Smoking status: Never Smoker   . Smokeless tobacco: Never Used  . Alcohol Use: Yes    Review of Systems  Constitutional: Negative for fever and chills.  HENT: Positive for congestion, rhinorrhea, sinus pressure and sore throat. Negative for ear pain, trouble swallowing and voice change.   Respiratory: Positive for cough. Negative for shortness of breath.   Cardiovascular: Negative for chest pain and palpitations.  Gastrointestinal: Negative for nausea, vomiting, abdominal pain and diarrhea.  Musculoskeletal: Negative for myalgias, back pain and arthralgias.  Skin: Negative for rash.  Neurological: Positive for dizziness and headaches. Negative for syncope and light-headedness.    Allergies  Review of patient's allergies indicates no  known allergies.  Home Medications   Prior to Admission medications   Medication Sig Start Date End Date Taking? Authorizing Provider  AMBULATORY NON FORMULARY MEDICATION CPAP device on 8 cm H2O with a Medium size Resmed Full Face Mask AirFit F20 mask and heated humidification. 07/08/15   Sean Hommel, DO  benzonatate (TESSALON) 200 MG capsule Take 1 capsule (200 mg total) by mouth at bedtime. Take as needed for cough 05/10/15   Kandra Nicolas, MD  cefdinir (OMNICEF) 300 MG capsule Take 1 capsule (300 mg total) by mouth 2 (two) times daily. 05/10/15   Kandra Nicolas, MD  doxycycline (VIBRAMYCIN) 100 MG capsule Take 1 capsule (100 mg total) by mouth 2 (two) times daily. One po bid x 7 days 08/11/15   Noland Fordyce, PA-C  ibuprofen (ADVIL,MOTRIN) 600 MG tablet Take 1 tablet (600 mg total) by mouth every 6 (six) hours as needed. 04/06/15   Noland Fordyce, PA-C  meclizine (ANTIVERT) 25 MG tablet Take 1 tablet (25 mg total) by mouth 3 (three) times daily as needed for dizziness. 08/11/15   Noland Fordyce, PA-C  Multiple Vitamin (MULTIVITAMIN) capsule Take 1 capsule by mouth daily.    Historical Provider, MD  predniSONE (DELTASONE) 20 MG tablet Take 1 tablet (20 mg total) by mouth 2 (two) times daily. Take with food. 05/10/15   Kandra Nicolas, MD  predniSONE (DELTASONE) 20 MG tablet 3 tabs po day one, then 2 po daily x 4 days 08/11/15   Noland Fordyce, PA-C   Meds Ordered and Administered this Visit   Medications  dexamethasone (DECADRON)  injection 10 mg (10 mg Intramuscular Given 08/11/15 1341)    BP 123/87 mmHg  Pulse 76  Temp(Src) 98 F (36.7 C) (Oral)  Wt 220 lb 12 oz (100.132 kg)  SpO2 98% No data found.   Physical Exam  Constitutional: He is oriented to person, place, and time. He appears well-developed and well-nourished.  HENT:  Head: Normocephalic and atraumatic.  Right Ear: Tympanic membrane is injected. A middle ear effusion is present.  Left Ear: Tympanic membrane normal.  Nose: Mucosal  edema present. Right sinus exhibits maxillary sinus tenderness and frontal sinus tenderness. Left sinus exhibits maxillary sinus tenderness and frontal sinus tenderness.  Mouth/Throat: Uvula is midline, oropharynx is clear and moist and mucous membranes are normal.  Eyes: Conjunctivae are normal. Pupils are equal, round, and reactive to light. No scleral icterus. Right eye exhibits nystagmus ( horizontal). Left eye exhibits nystagmus ( horizontal).  Neck: Normal range of motion. Neck supple.  Cardiovascular: Normal rate, regular rhythm and normal heart sounds.   Pulmonary/Chest: Effort normal and breath sounds normal. No respiratory distress. He has no wheezes. He has no rales. He exhibits no tenderness.  Abdominal: Soft. Bowel sounds are normal. He exhibits no distension and no mass. There is no tenderness. There is no rebound and no guarding.  Musculoskeletal: Normal range of motion.  Neurological: He is alert and oriented to person, place, and time.  Speech is clear. Alert to person, place and time. Mildly unsteady gait with fast movements or changes in position.   Skin: Skin is warm and dry.  Nursing note and vitals reviewed.   ED Course  Procedures (including critical care time)  Labs Review Labs Reviewed - No data to display  Imaging Review No results found.    MDM   1. Acute rhinosinusitis   2. Benign paroxysmal positional vertigo, bilateral    Pt c/o URI symptoms for 4 days, associated vertigo.  Horizontal nystagmus noted on exam as well as unsteadiness with quick position changes, otherwise normal neuro exam. Doubt stroke.  Symptoms likely secondary to sinusitis.  Tx in [facility]: Decadron 10mg  IM  Rx: Doxycycline, prednisone and antivert.  Advised pt not to drive while dizzy or while taking meclizine. Encouraged pt to call his wife to pick him up.   Home care instructions provided. Work note provided.  Noland Fordyce, PA-C 08/11/15 1510

## 2015-08-11 NOTE — Discharge Instructions (Signed)
You may take 400-600mg  Ibuprofen (Motrin) every 6-8 hours for fever and pain  Alternate with Tylenol  You may take 500mg  Tylenol every 4-6 hours as needed for fever and pain  Follow-up with your primary care provider next week for recheck of symptoms if not improving.  Be sure to drink plenty of fluids and rest, at least 8hrs of sleep a night, preferably more while you are sick. Return urgent care or go to closest ER if you cannot keep down fluids/signs of dehydration, fever not reducing with Tylenol, difficulty breathing/wheezing, stiff neck, worsening condition, or other concerns (see below)  Please take antibiotics as prescribed and be sure to complete entire course even if you start to feel better to ensure infection does not come back.   Benign Positional Vertigo Vertigo is the feeling that you or your surroundings are moving when they are not. Benign positional vertigo is the most common form of vertigo. The cause of this condition is not serious (is benign). This condition is triggered by certain movements and positions (is positional). This condition can be dangerous if it occurs while you are doing something that could endanger you or others, such as driving.  CAUSES In many cases, the cause of this condition is not known. It may be caused by a disturbance in an area of the inner ear that helps your brain to sense movement and balance. This disturbance can be caused by a viral infection (labyrinthitis), head injury, or repetitive motion. RISK FACTORS This condition is more likely to develop in:  Women.  People who are 68 years of age or older. SYMPTOMS Symptoms of this condition usually happen when you move your head or your eyes in different directions. Symptoms may start suddenly, and they usually last for less than a minute. Symptoms may include:  Loss of balance and falling.  Feeling like you are spinning or moving.  Feeling like your surroundings are spinning or  moving.  Nausea and vomiting.  Blurred vision.  Dizziness.  Involuntary eye movement (nystagmus). Symptoms can be mild and cause only slight annoyance, or they can be severe and interfere with daily life. Episodes of benign positional vertigo may return (recur) over time, and they may be triggered by certain movements. Symptoms may improve over time. DIAGNOSIS This condition is usually diagnosed by medical history and a physical exam of the head, neck, and ears. You may be referred to a health care provider who specializes in ear, nose, and throat (ENT) problems (otolaryngologist) or a provider who specializes in disorders of the nervous system (neurologist). You may have additional testing, including:  MRI.  A CT scan.  Eye movement tests. Your health care provider may ask you to change positions quickly while he or she watches you for symptoms of benign positional vertigo, such as nystagmus. Eye movement may be tested with an electronystagmogram (ENG), caloric stimulation, the Dix-Hallpike test, or the roll test.  An electroencephalogram (EEG). This records electrical activity in your brain.  Hearing tests. TREATMENT Usually, your health care provider will treat this by moving your head in specific positions to adjust your inner ear back to normal. Surgery may be needed in severe cases, but this is rare. In some cases, benign positional vertigo may resolve on its own in 2-4 weeks. HOME CARE INSTRUCTIONS Safety  Move slowly.Avoid sudden body or head movements.  Avoid driving.  Avoid operating heavy machinery.  Avoid doing any tasks that would be dangerous to you or others if a vertigo episode  would occur.  If you have trouble walking or keeping your balance, try using a cane for stability. If you feel dizzy or unstable, sit down right away.  Return to your normal activities as told by your health care provider. Ask your health care provider what activities are safe for  you. General Instructions  Take over-the-counter and prescription medicines only as told by your health care provider.  Avoid certain positions or movements as told by your health care provider.  Drink enough fluid to keep your urine clear or pale yellow.  Keep all follow-up visits as told by your health care provider. This is important. SEEK MEDICAL CARE IF:  You have a fever.  Your condition gets worse or you develop new symptoms.  Your family or friends notice any behavioral changes.  Your nausea or vomiting gets worse.  You have numbness or a "pins and needles" sensation. SEEK IMMEDIATE MEDICAL CARE IF:  You have difficulty speaking or moving.  You are always dizzy.  You faint.  You develop severe headaches.  You have weakness in your legs or arms.  You have changes in your hearing or vision.  You develop a stiff neck.  You develop sensitivity to light.   This information is not intended to replace advice given to you by your health care provider. Make sure you discuss any questions you have with your health care provider.   Document Released: 01/26/2006 Document Revised: 01/09/2015 Document Reviewed: 08/13/2014 Elsevier Interactive Patient Education 2016 [employer]  Dizziness Dizziness is a common problem. It makes you feel unsteady or lightheaded. You may feel like you are about to pass out (faint). Dizziness can lead to injury if you stumble or fall. Anyone can get dizzy, but dizziness is more common in older adults. This condition can be caused by a number of things, including:  Medicines.  Dehydration.  Illness. HOME CARE Following these instructions may help with your condition: Eating and Drinking  Drink enough fluid to keep your pee (urine) clear or pale yellow. This helps to keep you from getting dehydrated. Try to drink more clear fluids, such as water.  Do not drink alcohol.  Limit how much caffeine you drink or eat if told by your  doctor.  Limit how much salt you drink or eat if told by your doctor. Activity  Avoid making quick movements.  When you stand up from sitting in a chair, steady yourself until you feel okay.  In the morning, first sit up on the side of the bed. When you feel okay, stand slowly while you hold onto something. Do this until you know that your balance is fine.  Move your legs often if you need to stand in one place for a long time. Tighten and relax your muscles in your legs while you are standing.  Do not drive or use heavy machinery if you feel dizzy.  Avoid bending down if you feel dizzy. Place items in your home so that they are easy for you to reach without leaning over. Lifestyle  Do not use any tobacco products, including cigarettes, chewing tobacco, or electronic cigarettes. If you need help quitting, ask your doctor.  Try to lower your stress level, such as with yoga or meditation. Talk with your doctor if you need help. General Instructions  Watch your dizziness for any changes.  Take medicines only as told by your doctor. Talk with your doctor if you think that your dizziness is caused by a medicine that you  are taking.  Tell a friend or a family member that you are feeling dizzy. If he or she notices any changes in your behavior, have this person call your doctor.  Keep all follow-up visits as told by your doctor. This is important. GET HELP IF:  Your dizziness does not go away.  Your dizziness or light-headedness gets worse.  You feel sick to your stomach (nauseous).  You have trouble hearing.  You have new symptoms.  You are unsteady on your feet or you feel like the room is spinning. GET HELP RIGHT AWAY IF:  You throw up (vomit) or have diarrhea and are unable to eat or drink anything.  You have trouble:  Talking.  Walking.  Swallowing.  Using your arms, hands, or legs.  You feel generally weak.  You are not thinking clearly or you have trouble  forming sentences. It may take a friend or family member to notice this.  You have:  Chest pain.  Pain in your belly (abdomen).  Shortness of breath.  Sweating.  Your vision changes.  You are bleeding.  You have a headache.  You have neck pain or a stiff neck.  You have a fever.   This information is not intended to replace advice given to you by your health care provider. Make sure you discuss any questions you have with your health care provider.   Document Released: 04/09/2011 Document Revised: 09/04/2014 Document Reviewed: 04/16/2014 Elsevier Interactive Patient Education [employer].

## 2015-08-13 ENCOUNTER — Telehealth: Payer: Self-pay

## 2015-08-13 MED ORDER — AMOXICILLIN-POT CLAVULANATE 875-125 MG PO TABS
1.0000 | ORAL_TABLET | Freq: Two times a day (BID) | ORAL | Status: DC
Start: 2015-08-13 — End: 2016-08-20

## 2015-08-13 NOTE — Telephone Encounter (Signed)
Augmentin sent to [facility] on [address] to replace prednisone and doxycycline

## 2015-08-13 NOTE — Telephone Encounter (Signed)
Pt was seen at [facility] 2 days ago.  He was Rx'd prednisone, doxycycline, and antivert.  Pt believes these meds is causing him heart burn and hiccups. Pt did stop taking meds.

## 2015-08-13 NOTE — Telephone Encounter (Signed)
Pt.notified

## 2016-08-20 ENCOUNTER — Ambulatory Visit (INDEPENDENT_AMBULATORY_CARE_PROVIDER_SITE_OTHER): Payer: BC Managed Care – PPO | Admitting: Osteopathic Medicine

## 2016-08-20 VITALS — BP 119/67 | HR 75 | Ht 72.0 in | Wt 220.0 lb

## 2016-08-20 DIAGNOSIS — K591 Functional diarrhea: Secondary | ICD-10-CM

## 2016-08-20 LAB — COMPLETE METABOLIC PANEL WITH GFR
ALBUMIN: 4.6 g/dL (ref 3.6–5.1)
ALK PHOS: 49 U/L (ref 40–115)
ALT: 34 U/L (ref 9–46)
AST: 26 U/L (ref 10–40)
BILIRUBIN TOTAL: 1.1 mg/dL (ref 0.2–1.2)
BUN: 13 mg/dL (ref 7–25)
CO2: 20 mmol/L (ref 20–31)
CREATININE: 1.04 mg/dL (ref 0.60–1.35)
Calcium: 9.6 mg/dL (ref 8.6–10.3)
Chloride: 109 mmol/L (ref 98–110)
GLUCOSE: 72 mg/dL (ref 65–99)
Potassium: 4 mmol/L (ref 3.5–5.3)
Sodium: 143 mmol/L (ref 135–146)
Total Protein: 7 g/dL (ref 6.1–8.1)

## 2016-08-20 LAB — CBC WITH DIFFERENTIAL/PLATELET
BASOS ABS: 0 {cells}/uL (ref 0–200)
BASOS PCT: 0 %
EOS ABS: 250 {cells}/uL (ref 15–500)
Eosinophils Relative: 5 %
HCT: 44.9 % (ref 38.5–50.0)
Hemoglobin: 15.2 g/dL (ref 13.2–17.1)
Lymphocytes Relative: 32 %
Lymphs Abs: 1600 cells/uL (ref 850–3900)
MCH: 29.6 pg (ref 27.0–33.0)
MCHC: 33.9 g/dL (ref 32.0–36.0)
MCV: 87.5 fL (ref 80.0–100.0)
MONO ABS: 400 {cells}/uL (ref 200–950)
MONOS PCT: 8 %
MPV: 10.3 fL (ref 7.5–12.5)
NEUTROS ABS: 2750 {cells}/uL (ref 1500–7800)
Neutrophils Relative %: 55 %
PLATELETS: 242 10*3/uL (ref 140–400)
RBC: 5.13 MIL/uL (ref 4.20–5.80)
RDW: 13.1 % (ref 11.0–15.0)
WBC: 5 10*3/uL (ref 3.8–10.8)

## 2016-08-20 LAB — LIPID PANEL
Cholesterol: 183 mg/dL (ref <200)
HDL: 26 mg/dL — ABNORMAL LOW (ref >40)
LDL CALC: 131 mg/dL — AB (ref <100)
TRIGLYCERIDES: 131 mg/dL (ref <150)
Total CHOL/HDL Ratio: 7 Ratio — ABNORMAL HIGH (ref <5.0)
VLDL: 26 mg/dL (ref <30)

## 2016-08-20 LAB — TSH: TSH: 0.83 m[IU]/L (ref 0.40–4.50)

## 2016-08-20 LAB — LIPASE: LIPASE: 32 U/L (ref 7–60)

## 2016-08-20 MED ORDER — LOPERAMIDE HCL 2 MG PO TABS: 2.0000 mg | ORAL_TABLET | Freq: Four times a day (QID) | ORAL | 1 refills | Status: DC | PRN

## 2016-08-20 MED ORDER — DICYCLOMINE HCL 20 MG PO TABS: 20.0000 mg | ORAL_TABLET | Freq: Four times a day (QID) | ORAL | 1 refills | Status: DC | PRN

## 2016-08-20 MED ORDER — ONDANSETRON HCL 4 MG PO TABS: 4.0000 mg | ORAL_TABLET | Freq: Three times a day (TID) | ORAL | 1 refills | Status: DC

## 2016-08-20 NOTE — Progress Notes (Signed)
HPI: Micheal Hines is a 41 y.o. male  who presents to [facility] today, 08/20/16,  for chief complaint of:  Chief Complaint  Patient presents with  . Other    SWITCH FROM HOMMEL/ ABDOMAINAL ISSUES    Patient here to transfer care form previous PCP. Has not been seen in this clinic since 02/2015 at which oint treated for suspected shingles ("vesicles" is on problem list).   New problem: GI issue  . Context: no recent travel or unusual foods, he and wife have been decreasing carbs . Location & Quality: discomfort in LLQ associated with frequent loose, nonbloody nonwatery stool . Duration: ongoing few weeks . Timing: mornings after breakfast, no problems in the afternoons. Not daily but often several days in a row . Modifying factors: ginger, OTC Pepto, cutting back on coffe hasn't helped . Assoc signs/symptoms: manageable stress at work, no serious anxiety problems. Pain occasionally in LLQ wthout loose stool but pain isn't severe      Past medical, surgical, social and family history reviewed: Patient Active Problem List   Diagnosis Date Noted  . OSA (obstructive sleep apnea) 06/28/2015  . Vesicles 02/17/2015  . Hyperlipidemia 01/14/2012  . Skin lesion 08/13/2011   Past Surgical History:  Procedure Laterality Date  . WISDOM TOOTH EXTRACTION     Social History  Substance Use Topics  . Smoking status: Never Smoker  . Smokeless tobacco: Never Used  . Alcohol use Yes   Family History  Problem Relation Age of Onset  . Diabetes Father   . Hyperlipidemia Father   . Prostate cancer Maternal Grandfather   . Breast cancer Paternal Grandmother      Current medication list and allergy/intolerance information reviewed:   Current Outpatient Prescriptions  Medication Sig Dispense Refill  . AMBULATORY NON FORMULARY MEDICATION CPAP device on 8 cm H2O with a Medium size Resmed Full Face Mask AirFit F20 mask and heated humidification. 1 Units 0   . Multiple Vitamin (MULTIVITAMIN) capsule Take 1 capsule by mouth daily.     No current facility-administered medications for this visit.    No Known Allergies    Review of Systems:  Constitutional:  No  fever, no chills, No recent illness, No unintentional weight changes. No significant fatigue.   HEENT: No  headache, no vision change  Cardiac: No  chest pain, No  pressure, No palpitations  Respiratory:  No  shortness of breath.  Gastrointestinal: +abdominal pain LLQ occasionally across lower abdomen, No  nausea, No  vomiting,  No  blood in stool, +diarrhea, No  Constipation, no GERD   Musculoskeletal: No new myalgia/arthralgia  Skin: No  Rash  Hem/Onc: No  easy bruising/bleeding  Endocrine: No cold intolerance,  No heat intolerance. No polyuria/polydipsia/polyphagia   Neurologic: No  weakness, No  dizziness  Psychiatric: No  concerns with depression, No  concerns with anxiety, No sleep problems, No mood problems  Exam:  BP 119/67   Pulse 75   Ht 6' (1.829 m)   Wt 220 lb (99.8 kg)   BMI 29.84 kg/m   Constitutional: VS see above. General Appearance: alert, well-developed, well-nourished, NAD  Eyes: Normal lids and conjunctive, non-icteric sclera  Ears, Nose, Mouth, Throat: MMM, Normal external inspection ears/nares/mouth/lips/gums.   Neck: No masses, trachea midline. No thyroid enlargement. No tenderness/mass appreciated. No lymphadenopathy  Respiratory: Normal respiratory effort. no wheeze, no rhonchi, no rales  Cardiovascular: S1/S2 normal, no murmur, no rub/gallop auscultated. RRR. No lower extremity edema.   Gastrointestinal:  Nontender, no masses. No hepatomegaly, no splenomegaly. No hernia appreciated. Bowel sounds normal to slightly hyperactive. Rectal exam deferred.   Musculoskeletal: Gait normal. No clubbing/cyanosis of digits.   Neurological: Normal balance/coordination. No tremor.    Skin: warm, dry, intact. No rash/ulcer.   Psychiatric: Normal  judgment/insight. Normal mood and affect. Oriented x3.     ASSESSMENT/PLAN:   Functional diarrhea - Plan: CBC with Differential/Platelet, COMPLETE METABOLIC PANEL WITH GFR, Lipid panel, TSH, Lipase, Tissue transglutaminase, IgG, Tissue transglutaminase, IgA, ondansetron (ZOFRAN) 4 MG tablet, loperamide (IMODIUM A-D) 2 MG tablet, dicyclomine (BENTYL) 20 MG tablet    Patient Instructions   Plan:  Presuming IBS or IBS-like syndrome, though other diagnoses are possible.   Treatment to start:  Imodium and Zofran prior to meals - as needed (if you don't experience problems later in the day, no need to take it, but try taking before breakfast and see if this helps).   Bentyl for abdominal pain/spasm as needed - doesn't matter when it's used.   Avoid foods in list below (low FODMAP diet)  Labs today  Stool studies if symptoms no better in 2 weeks  GI referral if nothing is any better and labs/stool tests aren't giving [place] any answers  Come see me sooner if any question or concerns, or if worse     2017 UpToDate Characteristics and sources of common FODMAPs  Word that corresponds to letter in acronym Compounds in this category Foods that contain these compounds  F Fermentable  O Oligosaccharides Fructans, galacto-oligosaccharides Wheat, barley, rye, onion, leek, white part of spring onion, garlic, shallots, artichokes, beetroot, fennel, peas, chicory, pistachio, cashews, legumes, lentils, and chickpeas  D Disaccharides Lactose Milk, custard, ice cream, and yogurt  M Monosaccharides "Free fructose" (fructose in excess of glucose) Apples, pears, mangoes, cherries, watermelon, asparagus, sugar snap peas, honey, high-fructose corn syrup  A And  P Polyols Sorbitol, mannitol, maltitol, and xylitol Apples, pears, apricots, cherries, nectarines, peaches, plums, watermelon, mushrooms, cauliflower, artificially sweetened chewing gum and confectionery  FODMAPs: fermentable oligosaccharides,  disaccharides, monosaccharides, and polyols. Adapted by permission from [employer]: [employer]. Agustin Cree, [employer], [place]. Short-chain carbohydrates and functional gastrointestinal disorders. Am J Gastroenterol 2013; 108:707. Copyright  2013. www.nature.com/ajg. Graphic [phone] Version 2.0       Visit summary with medication list and pertinent instructions was printed for patient to review. All questions at time of visit were answered - patient instructed to contact office with any additional concerns. ER/RTC precautions were reviewed with the patient. Follow-up plan: Return for recheck symptoms in 2-4 weeks, sooner if needed.

## 2016-08-20 NOTE — Patient Instructions (Addendum)
Plan:  Presuming IBS or IBS-like syndrome, though other diagnoses are possible.   Treatment to start:  Imodium and Zofran prior to meals - as needed (if you don't experience problems later in the day, no need to take it, but try taking before breakfast and see if this helps).   Bentyl for abdominal pain/spasm as needed - doesn't matter when it's used.   Avoid foods in list below (low FODMAP diet)  Labs today  Stool studies if symptoms no better in 2 weeks  GI referral if nothing is any better and labs/stool tests aren't giving [place] any answers  Come see me sooner if any question or concerns, or if worse     2017 UpToDate Characteristics and sources of common FODMAPs  Word that corresponds to letter in acronym Compounds in this category Foods that contain these compounds  F Fermentable  O Oligosaccharides Fructans, galacto-oligosaccharides Wheat, barley, rye, onion, leek, white part of spring onion, garlic, shallots, artichokes, beetroot, fennel, peas, chicory, pistachio, cashews, legumes, lentils, and chickpeas  D Disaccharides Lactose Milk, custard, ice cream, and yogurt  M Monosaccharides "Free fructose" (fructose in excess of glucose) Apples, pears, mangoes, cherries, watermelon, asparagus, sugar snap peas, honey, high-fructose corn syrup  A And  P Polyols Sorbitol, mannitol, maltitol, and xylitol Apples, pears, apricots, cherries, nectarines, peaches, plums, watermelon, mushrooms, cauliflower, artificially sweetened chewing gum and confectionery  FODMAPs: fermentable oligosaccharides, disaccharides, monosaccharides, and polyols. Adapted by permission from [employer]: [employer]. Agustin Cree, [employer], [place]. Short-chain carbohydrates and functional gastrointestinal disorders. Am J Gastroenterol 2013; 108:707. Copyright  2013. www.nature.com/ajg. Graphic [phone] Version 2.0

## 2016-08-24 LAB — TISSUE TRANSGLUTAMINASE, IGG: Tissue Transglut Ab: 3 U/mL (ref <6)

## 2016-08-24 LAB — TISSUE TRANSGLUTAMINASE, IGA: Tissue Transglutaminase Ab, IgA: 1 U/mL (ref <4)

## 2016-09-25 ENCOUNTER — Other Ambulatory Visit: Payer: Self-pay | Admitting: Osteopathic Medicine

## 2016-09-25 DIAGNOSIS — K591 Functional diarrhea: Secondary | ICD-10-CM

## 2017-04-29 ENCOUNTER — Encounter: Payer: Self-pay | Admitting: Family Medicine

## 2017-04-29 ENCOUNTER — Ambulatory Visit: Payer: BC Managed Care – PPO | Admitting: Family Medicine

## 2017-04-29 ENCOUNTER — Ambulatory Visit (INDEPENDENT_AMBULATORY_CARE_PROVIDER_SITE_OTHER): Payer: BC Managed Care – PPO

## 2017-04-29 VITALS — BP 136/92 | HR 74 | Wt 231.0 lb

## 2017-04-29 DIAGNOSIS — M778 Other enthesopathies, not elsewhere classified: Secondary | ICD-10-CM

## 2017-04-29 DIAGNOSIS — Z23 Encounter for immunization: Secondary | ICD-10-CM

## 2017-04-29 DIAGNOSIS — M25531 Pain in right wrist: Secondary | ICD-10-CM | POA: Diagnosis not present

## 2017-04-29 MED ORDER — DICLOFENAC SODIUM 1 % TD GEL: 2.0000 g | Freq: Four times a day (QID) | TRANSDERMAL | 11 refills | Status: DC

## 2017-04-29 NOTE — Progress Notes (Signed)
Subjective:    I'm seeing this patient as a consultation for:  Micheal Reeve, DO   CC: Right wrist pain  HPI: Micheal Hines notes a several year history of bilateral intermittent wrist pain.  Done well with intermittent bracing and over-the-counter medications.  He has had some workup in the past and was thought to have wrist tendinitis.  He notes that typing tends to exacerbate his pain.  However over the last 2 or 3 weeks he has had worsening pain in his wrist.  This is worse with wrist motion especially twisting pushing and pulling.  He denies any locking or catching but he can make his wrist pop in a painful way with wrist motion.  He denies any significant radiating pain weakness or numbness.  He is continued his usual treatments which have typically helped in the past but have not helped for this current issue.  He denies any new injury.  He works in Engineer, technical sales and notes that the pain is sometimes interfering with his ability to works  Past medical history, Surgical history, Family history not pertinant except as noted below, Social history, Allergies, and medications have been entered into the medical record, reviewed, and no changes needed.   Review of Systems: No headache, visual changes, nausea, vomiting, diarrhea, constipation, dizziness, abdominal pain, skin rash, fevers, chills, night sweats, weight loss, swollen lymph nodes, body aches, joint swelling, muscle aches, chest pain, shortness of breath, mood changes, visual or auditory hallucinations.   Objective:    Vitals:   04/29/17 1027  BP: (!) 136/92  Pulse: 74   General: Well Developed, well nourished, and in no acute distress.  Neuro/Psych: Alert and oriented x3, extra-ocular muscles intact, able to move all 4 extremities, sensation grossly intact. Skin: Warm and dry, no rashes noted.  Respiratory: Not using accessory muscles, speaking in full sentences, trachea midline.  Cardiovascular: Pulses palpable, no extremity  edema. Abdomen: Does not appear distended. MSK:  Right wrist normal-appearing Minimally tender at the radial styloid. Wrist motion is intact. Mildly positive Finkelstein's test. Negative Tinel's and Phalen's test Strength is intact.  Pulses capillary refill and sensation are intact  X-ray right wrist pending  Limited musculoskeletal ultrasound of the right dorsal wrist reveals slight hypoechoic fluid surrounding the third dorsal wrist compartment.  Tendon is intact.  Normal bony structures  No results found for this or any previous visit (from the past 24 hour(s)). No results found.  Impression and Recommendations:    Assessment and Plan: 41 y.o. male with right wrist pain.  Symptoms are consistent with tenosynovitis.  This may be de Quervain's tenosynovitis or intersection syndrome plan for conservative management with hand physical therapy, diclofenac gel, thumb spica splint.  Recheck in a few weeks if not better.  Flu vaccine given today prior to discharge.   Orders Placed This Encounter  Procedures  . DG Wrist Complete Right    Standing Status:   Future    Number of Occurrences:   1    Standing Expiration Date:   06/30/2018    Order Specific Question:   Reason for Exam (SYMPTOM  OR DIAGNOSIS REQUIRED)    Answer:   eval right wrist pain    Order Specific Question:   Preferred imaging location?    Answer:   [facility]    Order Specific Question:   Radiology Contrast Protocol - do NOT remove file path    Answer:   [email or web address]  . Flu Vaccine QUAD 36+ mos IM  [facility]  . Ambulatory referral to Physical Therapy    Referral Priority:   Routine    Referral Type:   Physical Medicine    Referral Reason:   Specialty Services Required    Requested Specialty:   Physical Therapy    Number of Visits Requested:   1   Meds ordered this encounter  Medications  . diclofenac sodium (VOLTAREN) 1 % GEL    Sig: Apply 2 g  topically 4 (four) times daily. To affected joint.    Dispense:  100 g    Refill:  11    Discussed warning signs or symptoms. Please see discharge instructions. Patient expresses understanding.

## 2017-04-29 NOTE — Patient Instructions (Signed)
Thank you for coming in today. Attend PT if needed.  Use voltaren gel for pain Use the brace as needed.  Recheck in 6 weeks if not better.  Get xray today.   De Quervain Tenosynovitis Tendons attach muscles to bones. They also help with joint movements. When tendons become irritated or swollen, it is called tendinitis. The extensor pollicis brevis (EPB) tendon connects the EPB muscle to a bone that is near the base of the thumb. The EPB muscle helps to straighten and extend the thumb. De Quervain tenosynovitis is a condition in which the EPB tendon lining (sheath) becomes irritated, thickened, and swollen. This condition is sometimes called stenosing tenosynovitis. This condition causes pain on the thumb side of the back of the wrist. What are the causes? Causes of this condition include:  Activities that repeatedly cause your thumb and wrist to extend.  A sudden increase in activity or change in activity that affects your wrist.  What increases the risk? This condition is more likely to develop in:  Females.  People who have diabetes.  Women who have recently given birth.  People who are over 72 years of age.  People who do activities that involve repeated hand and wrist motions, such as tennis, racquetball, volleyball, gardening, and taking care of children.  People who do heavy labor.  People who have poor wrist strength and flexibility.  People who do not warm up properly before activities.  What are the signs or symptoms? Symptoms of this condition include:  Pain or tenderness over the thumb side of the back of the wrist when your thumb and wrist are not moving.  Pain that gets worse when you straighten your thumb or extend your thumb or wrist.  Pain when the injured area is touched.  Locking or catching of the thumb joint while you bend and straighten your thumb.  Decreased thumb motion due to pain.  Swelling over the affected area.  How is this  diagnosed? This condition is diagnosed with a medical history and physical exam. Your health care provider will ask for details about your injury and ask about your symptoms. How is this treated? Treatment may include the use of icing and medicines to reduce pain and swelling. You may also be advised to wear a splint or brace to limit your thumb and wrist motion. In less severe cases, treatment may also include working with a physical therapist to strengthen your wrist and calm the irritation around your EPB tendon sheath. In severe cases, surgery may be needed. Follow these instructions at home: If you have a splint or brace:  Wear it as told by your health care provider. Remove it only as told by your health care provider.  Loosen the splint or brace if your fingers become numb and tingle, or if they turn cold and blue.  Keep the splint or brace clean and dry. Managing pain, stiffness, and swelling  If directed, apply ice to the injured area. ? Put ice in a plastic bag. ? Place a towel between your skin and the bag. ? Leave the ice on for 20 minutes, 2-3 times per day.  Move your fingers often to avoid stiffness and to lessen swelling.  Raise (elevate) the injured area above the level of your heart while you are sitting or lying down. General instructions  Return to your normal activities as told by your health care provider. Ask your health care provider what activities are safe for you.  Take over-the-counter and  prescription medicines only as told by your health care provider.  Keep all follow-up visits as told by your health care provider. This is important.  Do not drive or operate heavy machinery while taking prescription pain medicine. Contact a health care provider if:  Your pain, tenderness, or swelling gets worse, even if you have had treatment.  You have numbness or tingling in your wrist, hand, or fingers on the injured side. This information is not intended to  replace advice given to you by your health care provider. Make sure you discuss any questions you have with your health care provider. Document Released: 04/20/2005 Document Revised: 09/26/2015 Document Reviewed: 06/26/2014 Elsevier Interactive Patient Education  [employer].

## 2017-11-28 ENCOUNTER — Encounter: Payer: Self-pay | Admitting: Emergency Medicine

## 2017-11-28 ENCOUNTER — Emergency Department
Admission: EM | Admit: 2017-11-28 | Discharge: 2017-11-28 | Disposition: A | Discharge status: Home / Self Care | Payer: BC Managed Care – PPO | Source: Home / Self Care

## 2017-11-28 DIAGNOSIS — H01011 Ulcerative blepharitis right upper eyelid: Secondary | ICD-10-CM | POA: Diagnosis not present

## 2017-11-28 MED ORDER — CIPROFLOXACIN HCL 0.3 % OP OINT: TOPICAL_OINTMENT | OPHTHALMIC | 0 refills | Status: DC

## 2017-11-28 MED ORDER — DOXYCYCLINE HYCLATE 100 MG PO CAPS: 100.0000 mg | ORAL_CAPSULE | Freq: Two times a day (BID) | ORAL | 0 refills | Status: DC

## 2017-11-28 NOTE — ED Provider Notes (Signed)
[name]    CSN: [id number] Arrival date & time: 11/28/17  1112     History   Chief Complaint Chief Complaint  Patient presents with  . Eye Drainage  . Facial Swelling   HPI Micheal Hines is a 42 y.o. male presents for evaluation of right eye drainage and swelling. He complains of right eye tenderness, irritation, and swelling above the right eye lid worsening over the course of 2 days. Initially thought symptoms were allergy related. [place] today, eye right upper eyelid swelling had worsened, eye tenderness had increased, and he noted the present of drainage occurring from the eyelid. He is concern for infection. He is not have difficulty with vision. Pain is not occurring at the orbit or conjunctiva. No recent sinus infections or eye injury. Denies concern of foreign body. Past Medical History:  Diagnosis Date  . Hyperlipidemia   . Shingles     Patient Active Problem List   Diagnosis Date Noted  . Wrist tendonitis 04/29/2017  . OSA (obstructive sleep apnea) 06/28/2015  . Vesicles 02/17/2015  . Hyperlipidemia 01/14/2012  . Skin lesion 08/13/2011    Past Surgical History:  Procedure Laterality Date  . WISDOM TOOTH EXTRACTION         Home Medications    Prior to Admission medications   Medication Sig Start Date End Date Taking? Authorizing Provider  AMBULATORY NON FORMULARY MEDICATION CPAP device on 8 cm H2O with a Medium size Resmed Full Face Mask AirFit F20 mask and heated humidification. 07/08/15   Marcial Pacas, DO  diclofenac sodium (VOLTAREN) 1 % GEL Apply 2 g topically 4 (four) times daily. To affected joint. 04/29/17   Gregor Hams, MD  dicyclomine (BENTYL) 20 MG tablet TAKE 1 TABLET (20 MG TOTAL) BY MOUTH EVERY 6 (SIX) HOURS AS NEEDED FOR SPASMS (ABDOMINAL PAIN). 09/25/16   [name], DO  loperamide (IMODIUM A-D) 2 MG tablet Take 1 tablet (2 mg total) by mouth 4 (four) times daily as needed for diarrhea or loose stools (Take before meals  and bedtime). 08/20/16   [name], DO  Multiple Vitamin (MULTIVITAMIN) capsule Take 1 capsule by mouth daily.    [provider]  ondansetron (ZOFRAN) 4 MG tablet Take 1 tablet (4 mg total) by mouth 4 (four) times daily -  before meals and at bedtime. As needed for loose stool 08/20/16   [name], DO    Family History Family History  Problem Relation Age of Onset  . Diabetes Father   . Hyperlipidemia Father   . Prostate cancer Maternal Grandfather   . Breast cancer Paternal Grandmother     Social History Social History   Tobacco Use  . Smoking status: Never Smoker  . Smokeless tobacco: Never Used  Substance Use Topics  . Alcohol use: Yes  . Drug use: No     Allergies   Patient has no known allergies.   Review of Systems Review of Systems   Physical Exam Triage Vital Signs ED Triage Vitals  Enc Vitals Group     BP 11/28/17 1147 128/88     Pulse Rate 11/28/17 1147 77     Resp 11/28/17 1147 16     Temp 11/28/17 1147 98.6 F (37 C)     Temp Source 11/28/17 1147 Oral     SpO2 11/28/17 1147 99 %     Weight --      Height --      Head Circumference --  Peak Flow --      Pain Score 11/28/17 1148 2     Pain Loc --      Pain Edu? --      Excl. in [place]? --    No data found.  Updated Vital Signs BP 128/88 (BP Location: Right Arm)   Pulse 77   Temp 98.6 F (37 C) (Oral)   Resp 16   SpO2 99%   Visual Acuity Right Eye Distance:   Left Eye Distance:   Bilateral Distance:    Right Eye Near:   Left Eye Near:    Bilateral Near:     Physical Exam  Constitutional: He appears well-developed and well-nourished.  Eyes: Conjunctivae, EOM and lids are normal. Lids are everted and swept, no foreign bodies found. Left eye exhibits hordeolum.    Vision is intact. Patient able to read document posted on wall without straining or impaired visual acuity   Cardiovascular: Normal rate.  Pulmonary/Chest: Effort normal.     UC Treatments /  Results  Labs (all labs ordered are listed, but only abnormal results are displayed) Labs Reviewed - No data to display  EKG None  Radiology No results found.  Procedures Procedures (including critical care time)  Medications Ordered in UC Medications - No data to display  Initial Impression / Assessment and Plan / UC Course  I have reviewed the triage vital signs and the nursing notes.  Pertinent labs & imaging results that were available during my care of the patient were reviewed by me and considered in my medical decision making (see chart for details).   Micheal Hines presents today with 2 days of gradually worsening right eyelid  redness, puffiness, and tenderness.  On examination right eyelid is significant for a small ulceration with expressive exduate immediately following the extension of the eyelashes situated at lateral right corner of upper eyelid.  Visual acuity remains intact.  Conjunctiva remained pink and moist without exudate.  Patient did complain of drainage with in the eye occurring this morning although denies any itching of burning with in the orbital region.  Will cover with both an oral antibiotic and patient opts for a topical antibiotic for application to the upper eyelid. Prescribing 1 band of Cipro ointment to the upper eyelid twice daily x 5 days.  Prescribing doxycycline 100 mg twice daily with food x 10 days. Symptoms of worsening eye infection discussed, along with strict follow-up precautions. Patient verbalized understanding.   Final Clinical Impressions(s) / UC Diagnoses   Final diagnoses:  Ulcerative blepharitis of right upper eyelid     Discharge Instructions     Apply warm compresses to right eye 2-3 times daily. Take medication as prescribed. If visual changes occur or eye swelling worsens or doesn't improve, follow-up with PCP or at ED immediately.     ED Prescriptions    Medication Sig Dispense Auth. Provider   doxycycline (VIBRAMYCIN) 100  MG capsule Take 1 capsule (100 mg total) by mouth 2 (two) times daily. 20 capsule Scot Jun, FNP   ciprofloxacin (CILOXAN) 0.3 % ophthalmic ointment Apply 1 band application to upper right eye lid twice daily x 5 days. 3.5 g Scot Jun, FNP     Controlled Substance Prescriptions [place] Controlled Substance Registry consulted? No   Scot Jun, [place] 11/30/17 1209

## 2017-11-28 NOTE — Discharge Instructions (Signed)
Apply warm compresses to right eye 2-3 times daily. Take medication as prescribed. If visual changes occur or eye swelling worsens or doesn't improve, follow-up with PCP or at ED immediately.

## 2017-11-28 NOTE — ED Triage Notes (Signed)
Patient presents to [facility] with C/O discomfort in the right eye times two days woke this morning with drainage and swelling, redness.

## 2018-01-04 ENCOUNTER — Encounter: Payer: Self-pay | Admitting: Osteopathic Medicine

## 2018-01-04 ENCOUNTER — Ambulatory Visit: Payer: BC Managed Care – PPO | Admitting: Osteopathic Medicine

## 2018-01-04 ENCOUNTER — Encounter: Payer: Self-pay | Admitting: Gastroenterology

## 2018-01-04 VITALS — BP 133/87 | HR 77 | Temp 98.4°F | Wt 235.2 lb

## 2018-01-04 DIAGNOSIS — Z23 Encounter for immunization: Secondary | ICD-10-CM

## 2018-01-04 DIAGNOSIS — R030 Elevated blood-pressure reading, without diagnosis of hypertension: Secondary | ICD-10-CM | POA: Diagnosis not present

## 2018-01-04 DIAGNOSIS — G43809 Other migraine, not intractable, without status migrainosus: Secondary | ICD-10-CM

## 2018-01-04 DIAGNOSIS — K591 Functional diarrhea: Secondary | ICD-10-CM

## 2018-01-04 MED ORDER — SUMATRIPTAN SUCCINATE 50 MG PO TABS: 50.0000 mg | ORAL_TABLET | ORAL | 0 refills | Status: DC | PRN

## 2018-01-04 MED ORDER — ONDANSETRON 8 MG PO TBDP: 8.0000 mg | ORAL_TABLET | Freq: Three times a day (TID) | ORAL | 3 refills | Status: DC | PRN

## 2018-01-04 NOTE — Patient Instructions (Signed)
Reduce/eliminate the following from your diet, or watch to see if anything on the list triggers symptoms.   2017 UpToDate Characteristics and sources of common FODMAPs  Word that corresponds to letter in acronym Compounds in this category Foods that contain these compounds  F Fermentable  O Oligosaccharides Fructans, galacto-oligosaccharides Wheat, barley, rye, onion, leek, white part of spring onion, garlic, shallots, artichokes, beetroot, fennel, peas, chicory, pistachio, cashews, legumes, lentils, and chickpeas  D Disaccharides Lactose Milk, custard, ice cream, and yogurt  M Monosaccharides "Free fructose" (fructose in excess of glucose) Apples, pears, mangoes, cherries, watermelon, asparagus, sugar snap peas, honey, high-fructose corn syrup  A And  P Polyols Sorbitol, mannitol, maltitol, and xylitol Apples, pears, apricots, cherries, nectarines, peaches, plums, watermelon, mushrooms, cauliflower, artificially sweetened chewing gum and confectionery  FODMAPs: fermentable oligosaccharides, disaccharides, monosaccharides, and polyols. Adapted by permission from [employer]: [employer]. Agustin Cree, [employer], [place]. Short-chain carbohydrates and functional gastrointestinal disorders. Am J Gastroenterol 2013; 108:707. Copyright  2013. www.nature.com/ajg. Graphic [phone] Version 2.0

## 2018-01-04 NOTE — Progress Notes (Signed)
HPI: Micheal Hines is a 42 y.o. male who  has a past medical history of Hyperlipidemia and Shingles.  he presents to [facility] today, 01/04/18,  for chief complaint of:  GI problem  . Context: No recent travel or unusual foods  . Location/Quality: nausea, upset stomach, diarrhea which was a bit darker perhaps because of Pepto Bismol use.  . Duration: over the weekend (today is Tues, started on Friday) . Assoc signs/symptoms: migraine as well lasted all weekend, nausea associated with migraine.  . Zofran helped, Bentyl did nothing, Imodium caused constipation. ?lactose intolerance? Tried probiotic and yogurt.     Past medical history, surgical history, and family history reviewed.  Current medication list and allergy/intolerance information reviewed.   (See remainder of HPI, ROS, Phys Exam below)      ASSESSMENT/PLAN: The primary encounter diagnosis was Functional diarrhea. Diagnoses of Need for influenza vaccination and Other migraine without status migrainosus, not intractable were also pertinent to this visit.  IBD-D w/ pain seems likely Pt would like to see GI before discussing new medications Low FODMAP diet given   Meds ordered this encounter  Medications  . ondansetron (ZOFRAN-ODT) 8 MG disintegrating tablet    Sig: Take 1 tablet (8 mg total) by mouth every 8 (eight) hours as needed for nausea.    Dispense:  20 tablet    Refill:  3  . SUMAtriptan (IMITREX) 50 MG tablet    Sig: Take 1 tablet (50 mg total) by mouth every 2 (two) hours as needed for migraine. May repeat in 2 hours if headache persists or recurs.    Dispense:  20 tablet    Refill:  0   Orders Placed This Encounter  Procedures  . Flu Vaccine QUAD 6+ mos PF IM (Fluarix Quad PF)  . CBC with Differential/Platelet  . COMPLETE METABOLIC PANEL WITH GFR  . Lipid panel  . TSH  . Lipase  . Ambulatory referral to Gastroenterology     Follow-up plan: Return for annual  checkup/physical soonish .                  ############################################ ############################################ ############################################ ############################################    Outpatient Encounter Medications as of 01/04/2018  Medication Sig Note  . AMBULATORY NON FORMULARY MEDICATION CPAP device on 8 cm H2O with a Medium size Resmed Full Face Mask AirFit F20 mask and heated humidification.   . diclofenac sodium (VOLTAREN) 1 % GEL Apply 2 g topically 4 (four) times daily. To affected joint. 01/04/2018: As per pt, taking PRN   . loperamide (IMODIUM A-D) 2 MG tablet Take 1 tablet (2 mg total) by mouth 4 (four) times daily as needed for diarrhea or loose stools (Take before meals and bedtime). 01/04/2018: As per pt, taking PRN  . Multiple Vitamin (MULTIVITAMIN) capsule Take 1 capsule by mouth daily.   . ondansetron (ZOFRAN) 4 MG tablet Take 1 tablet (4 mg total) by mouth 4 (four) times daily -  before meals and at bedtime. As needed for loose stool   . ciprofloxacin (CILOXAN) 0.3 % ophthalmic ointment Apply 1 band application to upper right eye lid twice daily x 5 days. (Patient not taking: Reported on 01/04/2018)   . dicyclomine (BENTYL) 20 MG tablet TAKE 1 TABLET (20 MG TOTAL) BY MOUTH EVERY 6 (SIX) HOURS AS NEEDED FOR SPASMS (ABDOMINAL PAIN). (Patient not taking: Reported on 01/04/2018)   . doxycycline (VIBRAMYCIN) 100 MG capsule Take 1 capsule (100 mg total) by mouth 2 (two) times  daily. (Patient not taking: Reported on 01/04/2018)    No facility-administered encounter medications on file as of 01/04/2018.    No Known Allergies    Review of Systems:  Constitutional: No recent illness  HEENT: No  headache, no vision change  Cardiac: No  chest pain, No  pressure, No palpitations  Respiratory:  No  shortness of breath. No  Cough  Gastrointestinal: No  abdominal pain, no change on bowel habits  Musculoskeletal: No new  myalgia/arthralgia  Skin: No  Rash  Hem/Onc: No  easy bruising/bleeding, No  abnormal lumps/bumps  Neurologic: No  weakness, No  Dizziness  Psychiatric: No  concerns with depression, No  concerns with anxiety  Exam:  BP 133/87 (BP Location: Right Arm, Patient Position: Sitting, Cuff Size: Normal)   Pulse 77   Temp 98.4 F (36.9 C) (Oral)   Wt 235 lb 3.2 oz (106.7 kg)   BMI 31.90 kg/m   Constitutional: VS see above. General Appearance: alert, well-developed, well-nourished, NAD  Eyes: Normal lids and conjunctive, non-icteric sclera  Ears, Nose, Mouth, Throat: MMM, Normal external inspection ears/nares/mouth/lips/gums.  Neck: No masses, trachea midline.   Respiratory: Normal respiratory effort. no wheeze, no rhonchi, no rales  Cardiovascular: S1/S2 normal, no murmur, no rub/gallop auscultated. RRR.   Musculoskeletal: Gait normal. Symmetric and independent movement of all extremities  Neurological: Normal balance/coordination. No tremor.  Skin: warm, dry, intact.   Psychiatric: Normal judgment/insight. Normal mood and affect. Oriented x3.   Visit summary with medication list and pertinent instructions was printed for patient to review, advised to alert [place] if any changes needed. All questions at time of visit were answered - patient instructed to contact office with any additional concerns. ER/RTC precautions were reviewed with the patient and understanding verbalized.   Follow-up plan: Return for annual checkup/physical soonish .  Note: Total time spent 25 minutes, greater than 50% of the visit was spent face-to-face counseling and coordinating care for the following: The primary encounter diagnosis was Functional diarrhea. Diagnoses of Need for influenza vaccination, Other migraine without status migrainosus, not intractable, and Elevated blood pressure reading were also pertinent to this visit.Marland Kitchen  Please note: voice recognition software was used to produce this document,  and typos may escape review. Please contact Dr. Sheppard Coil for any needed clarifications.

## 2018-01-07 LAB — COMPLETE METABOLIC PANEL WITH GFR
AG RATIO: 1.8 (calc) (ref 1.0–2.5)
ALKALINE PHOSPHATASE (APISO): 50 U/L (ref 40–115)
ALT: 30 U/L (ref 9–46)
AST: 23 U/L (ref 10–40)
Albumin: 4.5 g/dL (ref 3.6–5.1)
BILIRUBIN TOTAL: 1.1 mg/dL (ref 0.2–1.2)
BUN: 14 mg/dL (ref 7–25)
CHLORIDE: 106 mmol/L (ref 98–110)
CO2: 26 mmol/L (ref 20–32)
Calcium: 9.4 mg/dL (ref 8.6–10.3)
Creat: 1.23 mg/dL (ref 0.60–1.35)
GFR, EST AFRICAN AMERICAN: 85 mL/min/{1.73_m2} (ref >=60)
GFR, Est Non African American: 73 mL/min/{1.73_m2} (ref >=60)
GLUCOSE: 99 mg/dL (ref 65–99)
Globulin: 2.5 g/dL (calc) (ref 1.9–3.7)
POTASSIUM: 4.2 mmol/L (ref 3.5–5.3)
Sodium: 141 mmol/L (ref 135–146)
TOTAL PROTEIN: 7 g/dL (ref 6.1–8.1)

## 2018-01-07 LAB — CBC WITH DIFFERENTIAL/PLATELET
BASOS ABS: 32 {cells}/uL (ref 0–200)
BASOS PCT: 0.5 %
EOS PCT: 4 %
Eosinophils Absolute: 252 cells/uL (ref 15–500)
HEMATOCRIT: 46.3 % (ref 38.5–50.0)
HEMOGLOBIN: 15.8 g/dL (ref 13.2–17.1)
LYMPHS ABS: 1865 {cells}/uL (ref 850–3900)
MCH: 30.5 pg (ref 27.0–33.0)
MCHC: 34.1 g/dL (ref 32.0–36.0)
MCV: 89.4 fL (ref 80.0–100.0)
MPV: 10.8 fL (ref 7.5–12.5)
Monocytes Relative: 7.9 %
NEUTROS ABS: 3654 {cells}/uL (ref 1500–7800)
Neutrophils Relative %: 58 %
Platelets: 234 10*3/uL (ref 140–400)
RBC: 5.18 10*6/uL (ref 4.20–5.80)
RDW: 12.6 % (ref 11.0–15.0)
Total Lymphocyte: 29.6 %
WBC mixed population: 498 cells/uL (ref 200–950)
WBC: 6.3 10*3/uL (ref 3.8–10.8)

## 2018-01-07 LAB — LIPID PANEL
CHOLESTEROL: 214 mg/dL — AB (ref <200)
HDL: 32 mg/dL — AB (ref >40)
LDL CHOLESTEROL (CALC): 154 mg/dL — AB
Non-HDL Cholesterol (Calc): 182 mg/dL (calc) — ABNORMAL HIGH (ref <130)
TRIGLYCERIDES: 153 mg/dL — AB (ref <150)
Total CHOL/HDL Ratio: 6.7 (calc) — ABNORMAL HIGH (ref <5.0)

## 2018-01-07 LAB — TSH: TSH: 1.65 mIU/L (ref 0.40–4.50)

## 2018-01-07 LAB — LIPASE: LIPASE: 28 U/L (ref 7–60)

## 2018-01-17 ENCOUNTER — Encounter: Payer: Self-pay | Admitting: Emergency Medicine

## 2018-01-17 ENCOUNTER — Emergency Department
Admission: EM | Admit: 2018-01-17 | Discharge: 2018-01-17 | Disposition: A | Discharge status: Home / Self Care | Payer: BC Managed Care – PPO | Source: Home / Self Care | Attending: Family Medicine | Admitting: Family Medicine

## 2018-01-17 DIAGNOSIS — H00011 Hordeolum externum right upper eyelid: Secondary | ICD-10-CM

## 2018-01-17 MED ORDER — ERYTHROMYCIN 5 MG/GM OP OINT: TOPICAL_OINTMENT | OPHTHALMIC | 0 refills | Status: DC

## 2018-01-17 NOTE — ED Triage Notes (Signed)
Pt c/o right eye pain and swelling x2 days. States he had similar sxs about 1 month ago.

## 2018-01-17 NOTE — ED Provider Notes (Signed)
[facility]    CSN: [id number] Arrival date & time: 01/17/18  6578     History   Chief Complaint Chief Complaint  Patient presents with  . Eye Problem    HPI Micheal Hines is a 42 y.o. male.   HPI Micheal Hines is a 42 y.o. male presenting to UC with c/o 3-4 days of worsening Right upper eyelid pain, redness, and small amount of clear drainage.  Pain is worse with touch. He had similar symptoms in the same area about 1 month ago. He believed the infection had cleared after using the prescribed ciprofloxacin ointment and took oral doxycycline but the symptoms quickly returned. He has used the leftover Ciprofloxacin ointment the last 3 days but no relief.  Denies fever, chills, cough, congestion. No change in vision. No trauma to his eye. He wears glasses but does not wear contacts.    Past Medical History:  Diagnosis Date  . Hyperlipidemia   . Shingles     Patient Active Problem List   Diagnosis Date Noted  . Wrist tendonitis 04/29/2017  . OSA (obstructive sleep apnea) 06/28/2015  . Vesicles 02/17/2015  . Hyperlipidemia 01/14/2012  . Skin lesion 08/13/2011    Past Surgical History:  Procedure Laterality Date  . WISDOM TOOTH EXTRACTION         Home Medications    Prior to Admission medications   Medication Sig Start Date End Date Taking? Authorizing Provider  AMBULATORY NON FORMULARY MEDICATION CPAP device on 8 cm H2O with a Medium size Resmed Full Face Mask AirFit F20 mask and heated humidification. 07/08/15   Marcial Pacas, DO  diclofenac sodium (VOLTAREN) 1 % GEL Apply 2 g topically 4 (four) times daily. To affected joint. 04/29/17   Gregor Hams, MD  erythromycin ophthalmic ointment Apply thin layer of ointment to eyelid 3 times daily foe 7 days 01/17/18   Noe Gens, PA-C  loperamide (IMODIUM A-D) 2 MG tablet Take 1 tablet (2 mg total) by mouth 4 (four) times daily as needed for diarrhea or loose stools (Take before meals and bedtime). 08/20/16    [name], DO  Multiple Vitamin (MULTIVITAMIN) capsule Take 1 capsule by mouth daily.    [provider]  ondansetron (ZOFRAN) 4 MG tablet Take 1 tablet (4 mg total) by mouth 4 (four) times daily -  before meals and at bedtime. As needed for loose stool 08/20/16   [name], DO  ondansetron (ZOFRAN-ODT) 8 MG disintegrating tablet Take 1 tablet (8 mg total) by mouth every 8 (eight) hours as needed for nausea. 01/04/18   [name], DO  SUMAtriptan (IMITREX) 50 MG tablet Take 1 tablet (50 mg total) by mouth every 2 (two) hours as needed for migraine. May repeat in 2 hours if headache persists or recurs. 01/04/18   [name], DO    Family History Family History  Problem Relation Age of Onset  . Diabetes Father   . Hyperlipidemia Father   . Prostate cancer Maternal Grandfather   . Breast cancer Paternal Grandmother     Social History Social History   Tobacco Use  . Smoking status: Never Smoker  . Smokeless tobacco: Never Used  Substance Use Topics  . Alcohol use: Yes  . Drug use: No     Allergies   Patient has no known allergies.   Review of Systems Review of Systems  Constitutional: Negative for chills and fever.  HENT: Negative for congestion, ear pain and rhinorrhea.   Eyes: Positive  for pain and redness. Negative for photophobia, discharge, itching and visual disturbance.       Right upper eyelid  Neurological: Negative for headaches.     Physical Exam Triage Vital Signs ED Triage Vitals  Enc Vitals Group     BP 01/17/18 0851 121/86     Pulse Rate 01/17/18 0851 88     Resp --      Temp 01/17/18 0851 98.2 F (36.8 C)     Temp Source 01/17/18 0851 Oral     SpO2 01/17/18 0851 97 %     Weight 01/17/18 0853 238 lb (108 kg)     Height --      Head Circumference --      Peak Flow --      Pain Score 01/17/18 0853 2     Pain Loc --      Pain Edu? --      Excl. in [place]? --    No data found.  Updated Vital Signs BP 121/86 (BP  Location: Right Arm)   Pulse 88   Temp 98.2 F (36.8 C) (Oral)   Wt 238 lb (108 kg)   SpO2 97%   BMI 32.28 kg/m   Visual Acuity- with glasses  Right Eye Distance: 20/20 Left Eye Distance: 20/20 Bilateral Distance:    Right Eye Near:   Left Eye Near:    Bilateral Near:     Physical Exam  Constitutional: He is oriented to person, place, and time. He appears well-developed and well-nourished.  HENT:  Head: Normocephalic and atraumatic.  Eyes: Pupils are equal, round, and reactive to light. Conjunctivae and EOM are normal. Right eye exhibits hordeolum.    Neck: Normal range of motion.  Cardiovascular: Normal rate.  Pulmonary/Chest: Effort normal.  Musculoskeletal: Normal range of motion.  Neurological: He is alert and oriented to person, place, and time.  Skin: Skin is warm and dry.  Psychiatric: He has a normal mood and affect. His behavior is normal.  Nursing note and vitals reviewed.    UC Treatments / Results  Labs (all labs ordered are listed, but only abnormal results are displayed) Labs Reviewed - No data to display  EKG None  Radiology No results found.  Procedures Procedures (including critical care time)  Medications Ordered in UC Medications - No data to display  Initial Impression / Assessment and Plan / UC Course  I have reviewed the triage vital signs and the nursing notes.  Pertinent labs & imaging results that were available during my care of the patient were reviewed by me and considered in my medical decision making (see chart for details).     Hx and exam c/w hordeolum. Will change pt from Ciprofloxacin ointment to Erythromycin Encouraged use of warm compresses. F/u with Micheal Hines (his eye doctor's office) for recheck if needed.  Final Clinical Impressions(s) / UC Diagnoses   Final diagnoses:  Hordeolum externum of right upper eyelid     Discharge Instructions      Please follow up with eye specialist in 4-5 days if not  improving, sooner if worsening, or if symptoms quickly come back.     ED Prescriptions    Medication Sig Dispense Auth. Provider   erythromycin ophthalmic ointment Apply thin layer of ointment to eyelid 3 times daily foe 7 days 3.5 g Noe Gens, PA-C     Controlled Substance Prescriptions Micheal Hines Controlled Substance Registry consulted? Not Applicable   Noe Gens, PA-C 01/17/18 0930

## 2018-01-17 NOTE — Discharge Instructions (Signed)
°  Please follow up with eye specialist in 4-5 days if not improving, sooner if worsening, or if symptoms quickly come back.

## 2018-02-13 ENCOUNTER — Emergency Department (INDEPENDENT_AMBULATORY_CARE_PROVIDER_SITE_OTHER): Payer: BC Managed Care – PPO

## 2018-02-13 ENCOUNTER — Encounter: Payer: Self-pay | Admitting: Emergency Medicine

## 2018-02-13 ENCOUNTER — Emergency Department (INDEPENDENT_AMBULATORY_CARE_PROVIDER_SITE_OTHER)
Admission: EM | Admit: 2018-02-13 | Discharge: 2018-02-13 | Disposition: A | Discharge status: Home / Self Care | Payer: BC Managed Care – PPO | Source: Home / Self Care | Attending: Emergency Medicine | Admitting: Emergency Medicine

## 2018-02-13 ENCOUNTER — Other Ambulatory Visit: Payer: Self-pay

## 2018-02-13 DIAGNOSIS — B9789 Other viral agents as the cause of diseases classified elsewhere: Secondary | ICD-10-CM | POA: Diagnosis not present

## 2018-02-13 DIAGNOSIS — R509 Fever, unspecified: Secondary | ICD-10-CM

## 2018-02-13 DIAGNOSIS — J069 Acute upper respiratory infection, unspecified: Secondary | ICD-10-CM

## 2018-02-13 DIAGNOSIS — J040 Acute laryngitis: Secondary | ICD-10-CM

## 2018-02-13 DIAGNOSIS — R05 Cough: Secondary | ICD-10-CM | POA: Diagnosis not present

## 2018-02-13 DIAGNOSIS — H1089 Other conjunctivitis: Secondary | ICD-10-CM | POA: Diagnosis not present

## 2018-02-13 LAB — POCT INFLUENZA A/B
INFLUENZA B, POC: NEGATIVE
Influenza A, POC: NEGATIVE

## 2018-02-13 LAB — POCT RAPID STREP A (OFFICE): RAPID STREP A SCREEN: NEGATIVE

## 2018-02-13 MED ORDER — OFLOXACIN 0.3 % OP SOLN: 1.0000 [drp] | Freq: Four times a day (QID) | OPHTHALMIC | 0 refills | Status: DC

## 2018-02-13 MED ORDER — BENZONATATE 100 MG PO CAPS: 100.0000 mg | ORAL_CAPSULE | Freq: Three times a day (TID) | ORAL | 0 refills | Status: DC | PRN

## 2018-02-13 NOTE — Discharge Instructions (Addendum)
Use antibiotic drops as instructed. Use cough capsules as needed. Force fluids. If you develop worsening shortness of breath or symptoms please see her primary care provider or go to the emergency room.

## 2018-02-13 NOTE — ED Triage Notes (Signed)
Patient reports cough, congestion, low grade fever, aches and hoarseness progressive worsening over past 4 days. No OTC today.

## 2018-02-13 NOTE — ED Provider Notes (Signed)
[name]    CSN: [id number] Arrival date & time: 02/13/18  1103     History   Chief Complaint Chief Complaint  Patient presents with  . Nasal Congestion  . Cough  . Hoarse  . Generalized Body Aches  . Fever    HPI Micheal Hines is a 42 y.o. male.  Patient had onset of illness on Wednesday with a raw sore throat. On Thursday he had developed laryngitis. Following that he has developed a cough which occasionally has been productive. He has had loose stools with low abdominal discomfort. He has a persistent headache. Thursday and Friday his temperature ranged between 100.4 and 99. Over the last 48 hours he has developed crusting and discomfort in both eyes. He does have a 33-year-old who was sick recently and goes to daycare his wife has also been sick recently but not as symptomatic as he has been. HPI  Past Medical History:  Diagnosis Date  . Hyperlipidemia   . Shingles     Patient Active Problem List   Diagnosis Date Noted  . Wrist tendonitis 04/29/2017  . OSA (obstructive sleep apnea) 06/28/2015  . Vesicles 02/17/2015  . Hyperlipidemia 01/14/2012  . Skin lesion 08/13/2011    Past Surgical History:  Procedure Laterality Date  . WISDOM TOOTH EXTRACTION         Home Medications    Prior to Admission medications   Medication Sig Start Date End Date Taking? Authorizing Provider  AMBULATORY NON FORMULARY MEDICATION CPAP device on 8 cm H2O with a Medium size Resmed Full Face Mask AirFit F20 mask and heated humidification. 07/08/15   Hommel, Hilliard Clark, DO  benzonatate (TESSALON) 100 MG capsule Take 1-2 capsules (100-200 mg total) by mouth 3 (three) times daily as needed for cough. 02/13/18   Darlyne Russian, MD  diclofenac sodium (VOLTAREN) 1 % GEL Apply 2 g topically 4 (four) times daily. To affected joint. 04/29/17   Gregor Hams, MD  erythromycin ophthalmic ointment Apply thin layer of ointment to eyelid 3 times daily foe 7 days 01/17/18   Noe Gens,  PA-C  loperamide (IMODIUM A-D) 2 MG tablet Take 1 tablet (2 mg total) by mouth 4 (four) times daily as needed for diarrhea or loose stools (Take before meals and bedtime). 08/20/16   [name], DO  Multiple Vitamin (MULTIVITAMIN) capsule Take 1 capsule by mouth daily.    [provider]  ofloxacin (OCUFLOX) 0.3 % ophthalmic solution Place 1 drop into both eyes 4 (four) times daily. 02/13/18   Darlyne Russian, MD  ondansetron (ZOFRAN) 4 MG tablet Take 1 tablet (4 mg total) by mouth 4 (four) times daily -  before meals and at bedtime. As needed for loose stool 08/20/16   [name], DO  ondansetron (ZOFRAN-ODT) 8 MG disintegrating tablet Take 1 tablet (8 mg total) by mouth every 8 (eight) hours as needed for nausea. 01/04/18   [name], DO  SUMAtriptan (IMITREX) 50 MG tablet Take 1 tablet (50 mg total) by mouth every 2 (two) hours as needed for migraine. May repeat in 2 hours if headache persists or recurs. 01/04/18   [name], DO    Family History Family History  Problem Relation Age of Onset  . Diabetes Father   . Hyperlipidemia Father   . Prostate cancer Maternal Grandfather   . Breast cancer Paternal Grandmother     Social History Social History   Tobacco Use  . Smoking status: Never Smoker  . Smokeless  tobacco: Never Used  Substance Use Topics  . Alcohol use: Yes  . Drug use: No     Allergies   Patient has no known allergies.   Review of Systems Review of Systems  Constitutional: Positive for fatigue and fever.  HENT: Positive for congestion, nosebleeds and sore throat.   Eyes: Positive for discharge and redness.  Respiratory: Positive for cough. Negative for wheezing.   Cardiovascular: Negative.   Gastrointestinal: Positive for diarrhea.  Genitourinary: Negative.      Physical Exam Triage Vital Signs ED Triage Vitals  Enc Vitals Group     BP 02/13/18 1127 118/84     Pulse Rate 02/13/18 1127 (!) 111     Resp 02/13/18 1127  18     Temp 02/13/18 1127 98.5 F (36.9 C)     Temp Source 02/13/18 1127 Oral     SpO2 02/13/18 1127 98 %     Weight 02/13/18 1128 232 lb (105.2 kg)     Height 02/13/18 1128 6' (1.829 m)     Head Circumference --      Peak Flow --      Pain Score 02/13/18 1128 3     Pain Loc --      Pain Edu? --      Excl. in [place]? --    No data found.  Updated Vital Signs BP 118/84 (BP Location: Right Arm)   Pulse (!) 111   Temp 98.5 F (36.9 C) (Oral)   Resp 18   Ht 6' (1.829 m)   Wt 105.2 kg   SpO2 98%   BMI 31.46 kg/m   Visual Acuity Right Eye Distance:   Left Eye Distance:   Bilateral Distance:    Right Eye Near:   Left Eye Near:    Bilateral Near:     Physical Exam  Constitutional: He appears well-developed and well-nourished.  HENT:  TMs are clear. The turbinates are red and irritated. Throat exam reveals redness of the posterior pharynx.  Eyes:  Pupils equal reactive to light. There is bilateral redness of the scleral conjunctiva. There is a purulent discharge from both eyes.  Neck: Normal range of motion.  Cardiovascular: Normal rate and regular rhythm.  Pulmonary/Chest: Effort normal and breath sounds normal.  Abdominal: Soft. There is no tenderness.  Lymphadenopathy:    He has no cervical adenopathy.     UC Treatments / Results  Labs (all labs ordered are listed, but only abnormal results are displayed) Labs Reviewed  STREP A DNA PROBE  POCT INFLUENZA A/B  POCT RAPID STREP A (OFFICE)    EKG None  Radiology Dg Chest 2 View  Result Date: 02/13/2018 CLINICAL DATA:  Patient c/o cough, congestion, low grade fever, aches and hoarseness progressive worsening over past 4 days. EXAM: CHEST - 2 VIEW COMPARISON:  None. FINDINGS: The heart size and mediastinal contours are within normal limits. Both lungs are clear. The visualized skeletal structures are unremarkable. IMPRESSION: No active cardiopulmonary disease.  No evidence of pneumonia. Electronically Signed   By:  Franki Cabot M.D.   On: 02/13/2018 13:26    Procedures Procedures (including critical care time)  Medications Ordered in UC Medications - No data to display  Initial Impression / Assessment and Plan / UC Course  I have reviewed the triage vital signs and the nursing notes.  Pertinent labs & imaging results that were available during my care of the patient were reviewed by me and considered in my medical decision making (see  chart for details). Patient has what appears to be a viral illness with developing bilateral conjunctivitis. He will be treated symptomatically with cold medications. Prescriptions written for Ocuflox eyedrops as well as Tessalon Perles for cough.     Final Clinical Impressions(s) / UC Diagnoses   Final diagnoses:  Viral URI with cough  Other conjunctivitis of both eyes  Laryngitis     Discharge Instructions     Use antibiotic drops as instructed. Use cough capsules as needed. Force fluids. If you develop worsening shortness of breath or symptoms please see her primary care provider or go to the emergency room.    ED Prescriptions    Medication Sig Dispense Auth. Provider   ofloxacin (OCUFLOX) 0.3 % ophthalmic solution Place 1 drop into both eyes 4 (four) times daily. 5 mL Darlyne Russian, MD   benzonatate (TESSALON) 100 MG capsule Take 1-2 capsules (100-200 mg total) by mouth 3 (three) times daily as needed for cough. 40 capsule Darlyne Russian, MD     Controlled Substance Prescriptions [place] Controlled Substance Registry consulted? Not Applicable   Darlyne Russian, MD 02/13/18 1446

## 2018-02-14 ENCOUNTER — Telehealth: Payer: Self-pay | Admitting: Emergency Medicine

## 2018-02-14 LAB — STREP A DNA PROBE: GROUP A STREP PROBE: NOT DETECTED

## 2018-02-15 ENCOUNTER — Ambulatory Visit: Payer: BC Managed Care – PPO | Admitting: Gastroenterology

## 2018-02-15 NOTE — Telephone Encounter (Signed)
Patient advised of negative throat culture. Encouraged to treat symptomatically.

## 2018-03-07 ENCOUNTER — Encounter: Payer: Self-pay | Admitting: Osteopathic Medicine

## 2018-03-07 ENCOUNTER — Ambulatory Visit: Payer: BC Managed Care – PPO | Admitting: Osteopathic Medicine

## 2018-03-07 VITALS — BP 137/85 | HR 78 | Temp 97.9°F | Wt 229.6 lb

## 2018-03-07 DIAGNOSIS — R05 Cough: Secondary | ICD-10-CM | POA: Diagnosis not present

## 2018-03-07 DIAGNOSIS — Z0000 Encounter for general adult medical examination without abnormal findings: Secondary | ICD-10-CM | POA: Diagnosis not present

## 2018-03-07 DIAGNOSIS — H00011 Hordeolum externum right upper eyelid: Secondary | ICD-10-CM | POA: Diagnosis not present

## 2018-03-07 DIAGNOSIS — K591 Functional diarrhea: Secondary | ICD-10-CM | POA: Diagnosis not present

## 2018-03-07 DIAGNOSIS — R059 Cough, unspecified: Secondary | ICD-10-CM

## 2018-03-07 MED ORDER — PREDNISONE 20 MG PO TABS: 20.0000 mg | ORAL_TABLET | Freq: Two times a day (BID) | ORAL | 0 refills | Status: DC

## 2018-03-07 NOTE — Progress Notes (Signed)
HPI: Micheal Hines is a 42 y.o. male who  has a past medical history of Hyperlipidemia and Shingles.  he presents to Micheal Hines today, 03/07/18,  for chief complaint of: Annual Physical  Patient here for annual physical / wellness exam.   Additional concerns today include:   GI: suspected IBS-D/pain, has appt w/ Micheal Hines 04/06/18  Stye still bothering him, he also saw urgent care for this and changed topical abx, advised him f/u with his eye doctor  Ongoing cough, taking Tessalon, worse at the end of the day.  A couple of weeks ago, had significant viral flulike illness, cough has lingered since then.  He states it is overall getting better but just does not seem to be going away.  Worse with talking which he has to do for work throughout the day      Past medical, surgical, social and family history reviewed:  Patient Active Problem List   Diagnosis Date Noted  . Wrist tendonitis 04/29/2017  . OSA (obstructive sleep apnea) 06/28/2015  . Vesicles 02/17/2015  . Hyperlipidemia 01/14/2012  . Skin lesion 08/13/2011    Past Surgical History:  Procedure Laterality Date  . WISDOM TOOTH EXTRACTION      Social History   Tobacco Use  . Smoking status: Never Smoker  . Smokeless tobacco: Never Used  Substance Use Topics  . Alcohol use: Yes    Family History  Problem Relation Age of Onset  . Diabetes Father   . Hyperlipidemia Father   . Prostate cancer Maternal Grandfather   . Breast cancer Paternal Grandmother      Current medication list and allergy/intolerance information reviewed:    Current Outpatient Medications  Medication Sig Dispense Refill  . AMBULATORY NON FORMULARY MEDICATION CPAP device on 8 cm H2O with a Medium size Resmed Full Face Mask AirFit F20 mask and heated humidification. 1 Units 0  . benzonatate (TESSALON) 100 MG capsule Take 1-2 capsules (100-200 mg total) by mouth 3 (three) times daily as needed for cough. 40  capsule 0  . diclofenac sodium (VOLTAREN) 1 % GEL Apply 2 g topically 4 (four) times daily. To affected joint. 100 g 11  . erythromycin ophthalmic ointment Apply thin layer of ointment to eyelid 3 times daily foe 7 days 3.5 g 0  . loperamide (IMODIUM A-D) 2 MG tablet Take 1 tablet (2 mg total) by mouth 4 (four) times daily as needed for diarrhea or loose stools (Take before meals and bedtime). 90 tablet 1  . Multiple Vitamin (MULTIVITAMIN) capsule Take 1 capsule by mouth daily.    Marland Kitchen ofloxacin (OCUFLOX) 0.3 % ophthalmic solution Place 1 drop into both eyes 4 (four) times daily. 5 mL 0  . ondansetron (ZOFRAN) 4 MG tablet Take 1 tablet (4 mg total) by mouth 4 (four) times daily -  before meals and at bedtime. As needed for loose stool 90 tablet 1  . ondansetron (ZOFRAN-ODT) 8 MG disintegrating tablet Take 1 tablet (8 mg total) by mouth every 8 (eight) hours as needed for nausea. 20 tablet 3  . SUMAtriptan (IMITREX) 50 MG tablet Take 1 tablet (50 mg total) by mouth every 2 (two) hours as needed for migraine. May repeat in 2 hours if headache persists or recurs. 20 tablet 0   No current facility-administered medications for this visit.     No Known Allergies    Review of Systems:  Constitutional:  No  fever, no chills, +recent illness, No unintentional weight changes. No  significant fatigue.   HEENT: No  headache, no vision change, no hearing change, No sore throat, No  sinus pressure  Cardiac: No  chest pain, No  pressure, No palpitations, No  Orthopnea  Respiratory:  No  shortness of breath. +Cough  Gastrointestinal: No  abdominal pain, No  nausea, No  vomiting,  No  blood in stool, No  diarrhea, No  constipation   Musculoskeletal: No new myalgia/arthralgia  Skin: No  Rash, No other wounds/concerning lesions  Hem/Onc: No  easy bruising/bleeding, No  abnormal lymph node  Endocrine: No cold intolerance,  No heat intolerance  Neurologic: No  weakness, No  dizziness, No  slurred  speech/focal weakness/facial droop  Psychiatric: No  concerns with depression, No  concerns with anxiety, No sleep problems, No mood problems  Exam:  BP 137/85 (BP Location: Left Arm, Patient Position: Sitting, Cuff Size: Normal)   Pulse 78   Temp 97.9 F (36.6 C) (Oral)   Wt 229 lb 9.6 oz (104.1 kg)   BMI 31.14 kg/m   Constitutional: VS see above. General Appearance: alert, well-developed, well-nourished, NAD  Eyes: Normal lids and conjunctive, non-icteric sclera  Ears, Nose, Mouth, Throat: MMM, Normal external inspection ears/nares/mouth/lips/gums. TM normal bilaterally. Pharynx/tonsils no erythema, no exudate. Nasal mucosa normal.   Neck: No masses, trachea midline. No thyroid enlargement. No tenderness/mass appreciated. No lymphadenopathy  Respiratory: Normal respiratory effort. no wheeze, no rhonchi, no rales  Cardiovascular: S1/S2 normal, no murmur, no rub/gallop auscultated. RRR. No lower extremity edema.   Gastrointestinal: Nontender, no masses. No hepatomegaly, no splenomegaly. No hernia appreciated. Bowel sounds normal. Rectal exam deferred.   Musculoskeletal: Gait normal. No clubbing/cyanosis of digits.   Neurological: Normal balance/coordination. No tremor. No cranial nerve deficit on limited exam. Motor and sensation intact and symmetric. Cerebellar reflexes intact.   Skin: warm, dry, intact. No rash/ulcer. No concerning nevi or subq nodules on limited exam.    Psychiatric: Normal judgment/insight. Normal mood and affect. Oriented x3.      ASSESSMENT/PLAN:   Annual physical exam  Cough - sounds likely post-viral cough syndrome, trial steroids burst and consider inhaler if needed   Functional diarrhea - improved w/ avoidance of certain foods, will keep f/u w/ GI  Hordeolum externum of right upper eyelid - improved, f/u prn eye doctor or here    The 10-year ASCVD risk score Mikey Bussing DC Brooke Bonito., et al., 2013) is: 2.7%   Values used to calculate the score:      Age: 89 years     Sex: Male     Is Non-Hispanic African American: No     Diabetic: No     Tobacco smoker: No     Systolic Blood Pressure: 973 mmHg     Is BP treated: No     HDL Cholesterol: 32 mg/dL     Total Cholesterol: 214 mg/dL    Patient Instructions   General Preventive Care  Most recent routine screening lipids/other labs: already done.  Cholesterol borderline but nothing we need to start medicines for   Everyone should have blood pressure checked once per year.   Tobacco: don't! Alcohol: responsible moderation is ok for most adults - if you have concerns about your alcohol intake, please talk to me! Recreational/Illicit Drugs: don't!  Exercise: as tolerated to reduce risk of cardiovascular disease and diabetes. Strength training will also prevent osteoporosis.   Mental health: if need for mental health care (medicines, counseling, other), or concerns about moods, please let me know!   Sexual  health: if need for STD testing, or if concerns with libido/pain problems, please let me know! Vaccines  Flu vaccine: thanks for getting vaccinated this year!  Shingles vaccine: Shingrix recommended after age 65   Pneumonia vaccines: Prevnar and Pneumovax recommended after age 58, sooner if diabetes, COPD/asthma, others  Tetanus booster: Tdap recommended every 10 years - done 12/2010 Cancer screenings   Colon cancer screening: recommended for everyone at age 73, but some folks need a colonoscopy sooner if risk factors or concern for other illness   Prostate cancer screening: recommendations vary, optional PSA blood test for men around age 67  Lung cancer screening: CT chest every year for those sge 60 to 40 years old with  ?30 pack year smoking history, who either currently smoke or have quit within the past 15 years Infection screenings . HIV: recommended screening at least once age 23-65, more often if risk factors  . Gonorrhea/Chlamydia: screening as needed . Hepatitis  C: recommended for anyone born 79-1963 . TB: certain at-risk populations, or depending on work requirements and/or travel history Other . Bone Density Test: recommended for men at age 41 . Advanced Directive: Living Will and/or Healthcare Power of Attorney recommended for all adults, regardless of age or health!     Please note: Preventive care issues were addressed today as part of your annual wellness physical, and this care should be covered under your insurance. However there were other medical issues which were also addressed today, and insurance may bill you separately for a "problem-based visit" for this care: GI issues, Stye, New Problem of Cough. Any questions or concerns about charges which may appear on your billing statement should be directed to your insurance company or to [facility] billing department, and they will contact our office if there are further concerns.     Immunization History  Administered Date(s) Administered  . Influenza Whole 01/07/2011  . Influenza,inj,Quad PF,6+ Mos 01/19/2013, 04/29/2017, 01/04/2018  . Tdap 12/04/2010          Recent Results (from the past 2160 hour(s))  CBC with Differential/Platelet     Status: None   Collection Time: 01/06/18  7:45 AM  Result Value Ref Range   WBC 6.3 3.8 - 10.8 Thousand/uL   RBC 5.18 4.20 - 5.80 Million/uL   Hemoglobin 15.8 13.2 - 17.1 g/dL   HCT 46.3 38.5 - 50.0 %   MCV 89.4 80.0 - 100.0 fL   MCH 30.5 27.0 - 33.0 pg   MCHC 34.1 32.0 - 36.0 g/dL   RDW 12.6 11.0 - 15.0 %   Platelets 234 140 - 400 Thousand/uL   MPV 10.8 7.5 - 12.5 fL   Neutro Abs 3,654 1,500 - 7,800 cells/uL   Lymphs Abs 1,865 850 - 3,900 cells/uL   WBC mixed population 498 200 - 950 cells/uL   Eosinophils Absolute 252 15 - 500 cells/uL   Basophils Absolute 32 0 - 200 cells/uL   Neutrophils Relative % 58 %   Total Lymphocyte 29.6 %   Monocytes Relative 7.9 %   Eosinophils Relative 4.0 %   Basophils Relative 0.5 %  COMPLETE  METABOLIC PANEL WITH GFR     Status: None   Collection Time: 01/06/18  7:45 AM  Result Value Ref Range   Glucose, Bld 99 65 - 99 mg/dL    Comment: .            Fasting reference interval .    BUN 14 7 - 25 mg/dL   Creat  1.23 0.60 - 1.35 mg/dL   GFR, Est Non African American 73 > OR = 60 mL/min/1.28m2   GFR, Est African American 85 > OR = 60 mL/min/1.64m2   BUN/Creatinine Ratio NOT APPLICABLE 6 - 22 (calc)   Sodium 141 135 - 146 mmol/L   Potassium 4.2 3.5 - 5.3 mmol/L   Chloride 106 98 - 110 mmol/L   CO2 26 20 - 32 mmol/L   Calcium 9.4 8.6 - 10.3 mg/dL   Total Protein 7.0 6.1 - 8.1 g/dL   Albumin 4.5 3.6 - 5.1 g/dL   Globulin 2.5 1.9 - 3.7 g/dL (calc)   AG Ratio 1.8 1.0 - 2.5 (calc)   Total Bilirubin 1.1 0.2 - 1.2 mg/dL   Alkaline phosphatase (APISO) 50 40 - 115 U/L   AST 23 10 - 40 U/L   ALT 30 9 - 46 U/L  Lipid panel     Status: Abnormal   Collection Time: 01/06/18  7:45 AM  Result Value Ref Range   Cholesterol 214 (H) <200 mg/dL   HDL 32 (L) >40 mg/dL   Triglycerides 153 (H) <150 mg/dL   LDL Cholesterol (Calc) 154 (H) mg/dL (calc)    Comment: Reference range: <100 . Desirable range <100 mg/dL for primary prevention;   <70 mg/dL for patients with CHD or diabetic patients  with > or = 2 CHD risk factors. Marland Kitchen LDL-C is now calculated using the Martin-Hopkins  calculation, which is a validated novel method providing  better accuracy than the Friedewald equation in the  estimation of LDL-C.  Cresenciano Genre et al. Annamaria Helling. 8144;818(56): 2061-2068  (http://education.[facility].com/faq/FAQ164)    Total CHOL/HDL Ratio 6.7 (H) <5.0 (calc)   Non-HDL Cholesterol (Calc) 182 (H) <130 mg/dL (calc)    Comment: For patients with diabetes plus 1 major ASCVD risk  factor, treating to a non-HDL-C goal of <100 mg/dL  (LDL-C of <70 mg/dL) is considered a therapeutic  option.   TSH     Status: None   Collection Time: 01/06/18  7:45 AM  Result Value Ref Range   TSH 1.65 0.40 - 4.50 mIU/L   Lipase     Status: None   Collection Time: 01/06/18  7:45 AM  Result Value Ref Range   Lipase 28 7 - 60 U/L  POCT Influenza A/B     Status: None   Collection Time: 02/13/18 11:56 AM  Result Value Ref Range   Influenza A, POC Negative Negative   Influenza B, POC Negative Negative  POCT rapid strep A     Status: None   Collection Time: 02/13/18 11:57 AM  Result Value Ref Range   Rapid Strep A Screen Negative Negative  Strep A DNA probe     Status: None   Collection Time: 02/13/18 11:57 AM  Result Value Ref Range   Group A Strep Probe NOT DETECTED NOT DETECT      Visit summary with medication list and pertinent instructions was printed for patient to review. All questions at time of visit were answered - patient instructed to contact office with any additional concerns. ER/RTC precautions were reviewed with the patient.   Follow-up plan: Return in about 1 year (around 03/08/2019) for annual physical, sooner if needed .     Please note: voice recognition software was used to produce this document, and typos may escape review. Please contact Micheal Hines. Sheppard Coil for any needed clarifications.

## 2018-03-07 NOTE — Patient Instructions (Signed)
General Preventive Care  Most recent routine screening lipids/other labs: already done.  Cholesterol borderline but nothing we need to start medicines for   Everyone should have blood pressure checked once per year.   Tobacco: don't! Alcohol: responsible moderation is ok for most adults - if you have concerns about your alcohol intake, please talk to me! Recreational/Illicit Drugs: don't!  Exercise: as tolerated to reduce risk of cardiovascular disease and diabetes. Strength training will also prevent osteoporosis.   Mental health: if need for mental health care (medicines, counseling, other), or concerns about moods, please let me know!   Sexual health: if need for STD testing, or if concerns with libido/pain problems, please let me know! Vaccines  Flu vaccine: thanks for getting vaccinated this year!  Shingles vaccine: Shingrix recommended after age 26   Pneumonia vaccines: Prevnar and Pneumovax recommended after age 64, sooner if diabetes, COPD/asthma, others  Tetanus booster: Tdap recommended every 10 years - done 12/2010 Cancer screenings   Colon cancer screening: recommended for everyone at age 58, but some folks need a colonoscopy sooner if risk factors or concern for other illness   Prostate cancer screening: recommendations vary, optional PSA blood test for men around age 106  Lung cancer screening: CT chest every year for those sge 46 to 40 years old with  ?30 pack year smoking history, who either currently smoke or have quit within the past 15 years Infection screenings . HIV: recommended screening at least once age 82-65, more often if risk factors  . Gonorrhea/Chlamydia: screening as needed . Hepatitis C: recommended for anyone born 44-1963 . TB: certain at-risk populations, or depending on work requirements and/or travel history Other . Bone Density Test: recommended for men at age 45 . Advanced Directive: Living Will and/or Healthcare Power of Attorney  recommended for all adults, regardless of age or health!     Please note: Preventive care issues were addressed today as part of your annual wellness physical, and this care should be covered under your insurance. However there were other medical issues which were also addressed today, and insurance may bill you separately for a "problem-based visit" for this care: GI issues, Stye, New Problem of Cough. Any questions or concerns about charges which may appear on your billing statement should be directed to your insurance company or to [facility] billing department, and they will contact our office if there are further concerns.     Immunization History  Administered Date(s) Administered  . Influenza Whole 01/07/2011  . Influenza,inj,Quad PF,6+ Mos 01/19/2013, 04/29/2017, 01/04/2018  . Tdap 12/04/2010

## 2018-03-17 ENCOUNTER — Ambulatory Visit: Payer: BC Managed Care – PPO | Admitting: Gastroenterology

## 2018-04-05 ENCOUNTER — Encounter

## 2018-04-05 ENCOUNTER — Other Ambulatory Visit (INDEPENDENT_AMBULATORY_CARE_PROVIDER_SITE_OTHER): Payer: BC Managed Care – PPO

## 2018-04-05 ENCOUNTER — Encounter: Payer: Self-pay | Admitting: Gastroenterology

## 2018-04-05 ENCOUNTER — Ambulatory Visit: Payer: BC Managed Care – PPO | Admitting: Gastroenterology

## 2018-04-05 VITALS — BP 128/88 | HR 76 | Ht 72.0 in | Wt 234.1 lb

## 2018-04-05 DIAGNOSIS — R152 Fecal urgency: Secondary | ICD-10-CM | POA: Diagnosis not present

## 2018-04-05 DIAGNOSIS — R12 Heartburn: Secondary | ICD-10-CM

## 2018-04-05 DIAGNOSIS — R1030 Lower abdominal pain, unspecified: Secondary | ICD-10-CM | POA: Diagnosis not present

## 2018-04-05 DIAGNOSIS — K642 Third degree hemorrhoids: Secondary | ICD-10-CM | POA: Diagnosis not present

## 2018-04-05 LAB — IGA: IgA: 267 mg/dL (ref 68–378)

## 2018-04-05 MED ORDER — GLYCOPYRROLATE 2 MG PO TABS: 2.0000 mg | ORAL_TABLET | Freq: Two times a day (BID) | ORAL | 11 refills | Status: DC

## 2018-04-05 MED ORDER — NA SULFATE-K SULFATE-MG SULF 17.5-3.13-1.6 GM/177ML PO SOLN: 1.0000 | Freq: Once | ORAL | 0 refills | Status: DC

## 2018-04-05 MED ORDER — NA SULFATE-K SULFATE-MG SULF 17.5-3.13-1.6 GM/177ML PO SOLN: 1.0000 | Freq: Once | ORAL | 0 refills | Status: AC

## 2018-04-05 NOTE — Progress Notes (Signed)
History of Present Illness: This is a 42 year old male referred by [name], DO for the evaluation of urgent stools and abdominal pain.  Patient relates about a 1 year history of frequent problems with lower abdominal pain and bowel movement frequency.  He states his stools are formed and normal to slightly soft but not loose or watery.  He relates many years of problems with hemorrhoids and over the past several years they prolapse with each bowel movement and require manual reduction.  He notes occasional small amounts of bleeding with bowel movements and on the tissue paper.  He has infrequent episodes of heartburn that he manages with Tums, Pepto-Bismol or Alka-Seltzer.  He occasionally notes mild nausea which is generally associated with lower abdominal pain or heartburn.  Blood work as below. Denies weight loss, constipation, diarrhea, change in stool caliber, melena, hematochezia, nausea, vomiting, dysphagia, chest pain.   CBC, CMP, lipase, TSH in 01/2018 were normal.  tTG negative in 08/2016  No Known Allergies Outpatient Medications Prior to Visit  Medication Sig Dispense Refill  . AMBULATORY NON FORMULARY MEDICATION CPAP device on 8 cm H2O with a Medium size Resmed Full Face Mask AirFit F20 mask and heated humidification. 1 Units 0  . diclofenac sodium (VOLTAREN) 1 % GEL Apply 2 g topically 4 (four) times daily. To affected joint. 100 g 11  . loperamide (IMODIUM A-D) 2 MG tablet Take 1 tablet (2 mg total) by mouth 4 (four) times daily as needed for diarrhea or loose stools (Take before meals and bedtime). 90 tablet 1  . Multiple Vitamin (MULTIVITAMIN) capsule Take 1 capsule by mouth daily.    . ondansetron (ZOFRAN) 4 MG tablet Take 1 tablet (4 mg total) by mouth 4 (four) times daily -  before meals and at bedtime. As needed for loose stool 90 tablet 1  . Probiotic Product (PROBIOTIC-10 PO) Take by mouth daily.    . SUMAtriptan (IMITREX) 50 MG tablet Take 1 tablet (50 mg total)  by mouth every 2 (two) hours as needed for migraine. May repeat in 2 hours if headache persists or recurs. 20 tablet 0  . benzonatate (TESSALON) 100 MG capsule Take 1-2 capsules (100-200 mg total) by mouth 3 (three) times daily as needed for cough. 40 capsule 0  . erythromycin ophthalmic ointment Apply thin layer of ointment to eyelid 3 times daily foe 7 days 3.5 g 0  . ofloxacin (OCUFLOX) 0.3 % ophthalmic solution Place 1 drop into both eyes 4 (four) times daily. 5 mL 0  . ondansetron (ZOFRAN-ODT) 8 MG disintegrating tablet Take 1 tablet (8 mg total) by mouth every 8 (eight) hours as needed for nausea. 20 tablet 3  . predniSONE (DELTASONE) 20 MG tablet Take 1 tablet (20 mg total) by mouth 2 (two) times daily with a meal. 10 tablet 0   No facility-administered medications prior to visit.    Past Medical History:  Diagnosis Date  . Hyperlipidemia   . Shingles   . Wears glasses    Past Surgical History:  Procedure Laterality Date  . WISDOM TOOTH EXTRACTION     Social History   Socioeconomic History  . Marital status: Married    Spouse name: Not on file  . Number of children: Not on file  . Years of education: Not on file  . Highest education level: Not on file  Occupational History  . Occupation: IT  Social Needs  . Financial resource strain: Not on file  . Food  insecurity:    Worry: Not on file    Inability: Not on file  . Transportation needs:    Medical: Not on file    Non-medical: Not on file  Tobacco Use  . Smoking status: Never Smoker  . Smokeless tobacco: Never Used  Substance and Sexual Activity  . Alcohol use: Yes  . Drug use: No  . Sexual activity: Yes    Birth control/protection: Pill  Lifestyle  . Physical activity:    Days per week: Not on file    Minutes per session: Not on file  . Stress: Not on file  Relationships  . Social connections:    Talks on phone: Not on file    Gets together: Not on file    Attends religious service: Not on file    Active  member of club or organization: Not on file    Attends meetings of clubs or organizations: Not on file    Relationship status: Not on file  Other Topics Concern  . Not on file  Social History Narrative  . Not on file   Family History  Problem Relation Age of Onset  . Diabetes Father   . Hyperlipidemia Father   . Prostate cancer Maternal Grandfather   . Breast cancer Paternal Grandmother      Review of Systems: Pertinent positive and negative review of systems were noted in the above HPI section. All other review of systems were otherwise negative.   Physical Exam: General: Well developed, well nourished, no acute distress Head: Normocephalic and atraumatic Eyes:  sclerae anicteric, EOMI Ears: Normal auditory acuity Mouth: No deformity or lesions Neck: Supple, no masses or thyromegaly Lungs: Clear throughout to auscultation Heart: Regular rate and rhythm; no murmurs, rubs or bruits Abdomen: Soft, non tender and non distended. No masses, hepatosplenomegaly or hernias noted. Normal Bowel sounds Rectal: Deferred to colonoscopy Musculoskeletal: Symmetrical with no gross deformities  Skin: No lesions on visible extremities Pulses:  Normal pulses noted Extremities: No clubbing, cyanosis, edema or deformities noted Neurological: Alert oriented x 4, grossly nonfocal Cervical Nodes:  No significant cervical adenopathy Inguinal Nodes: No significant inguinal adenopathy Psychological:  Alert and cooperative. Normal mood and affect   Assessment and Recommendations:  1. Suspected IBS. R/O IBD, neoplasm.  Fecal urgency and lower abdominal pain are the primary symptoms.  Lactose free diet if no benefit then low FODMAP diet.  Begin glycopyrrolate 2 mg p.o. twice daily.  Schedule colonoscopy. The risks (including bleeding, perforation, infection, missed lesions, medication reactions and possible hospitalization or surgery if complications occur), benefits, and alternatives to colonoscopy  with possible biopsy and possible polypectomy were discussed with the patient and they consent to proceed.   2. Infrequent heartburn. Antireflux measures.  Discontinue Alka-Seltzer.  Recommended Tums as needed and/or Pepcid AC as needed.  If symptoms become more frequent consider the addition of a regular H2 blocker or PPI  3. Grade 3 hemorrhoids.  Prolapse with each bowel movement and occasional bleeding.  Rule out other sources of hematochezia.  Colonoscopy as above.  Recommended proceeding with hemorrhoidal banding after colonoscopy.    cc: [name], [address], [place] [address]

## 2018-04-05 NOTE — Patient Instructions (Signed)
Your provider has requested that you go to the basement level for lab work before leaving today. Press "B" on the elevator. The lab is located at the first door on the left as you exit the elevator.  We have sent the following medications to your pharmacy for you to pick up at your convenience: glycopyrrolate 2 mg twice daily. Take your first dose well before your first meal of the day.  Patient advised to avoid spicy, acidic, citrus, chocolate, mints, fruit and fruit juices.  Limit the intake of caffeine, alcohol and Soda.  Don't exercise too soon after eating.  Don't lie down within 3-4 hours of eating.  Elevate the head of your bed.  Follow the low fod-map diet.   Call our office back after the colonoscopy if you want to schedule a hemorrhoid banding appointment.   Thank you for choosing me and [facility].  Pricilla Riffle. Dagoberto Ligas., MD., [place] Regal

## 2018-04-06 ENCOUNTER — Telehealth: Payer: Self-pay | Admitting: Osteopathic Medicine

## 2018-04-06 NOTE — Telephone Encounter (Signed)
Received a fax from [facility] that Diclofenac gel was approved from 1204/2019 through 04/06/2021. Form sent to scan and pharmacy aware.

## 2018-05-09 ENCOUNTER — Encounter: Payer: BC Managed Care – PPO | Admitting: Gastroenterology

## 2018-05-20 ENCOUNTER — Encounter: Payer: Self-pay | Admitting: Gastroenterology

## 2018-06-03 ENCOUNTER — Ambulatory Visit (AMBULATORY_SURGERY_CENTER): Payer: BC Managed Care – PPO | Admitting: Gastroenterology

## 2018-06-03 ENCOUNTER — Encounter: Payer: Self-pay | Admitting: Gastroenterology

## 2018-06-03 VITALS — BP 126/80 | HR 73 | Temp 98.4°F | Resp 14 | Ht 72.0 in | Wt 234.0 lb

## 2018-06-03 DIAGNOSIS — D122 Benign neoplasm of ascending colon: Secondary | ICD-10-CM

## 2018-06-03 DIAGNOSIS — D128 Benign neoplasm of rectum: Secondary | ICD-10-CM

## 2018-06-03 DIAGNOSIS — R152 Fecal urgency: Secondary | ICD-10-CM | POA: Diagnosis not present

## 2018-06-03 DIAGNOSIS — D123 Benign neoplasm of transverse colon: Secondary | ICD-10-CM

## 2018-06-03 DIAGNOSIS — R1030 Lower abdominal pain, unspecified: Secondary | ICD-10-CM

## 2018-06-03 DIAGNOSIS — K921 Melena: Secondary | ICD-10-CM

## 2018-06-03 MED ORDER — SODIUM CHLORIDE 0.9 % IV SOLN: 500.0000 mL | Freq: Once | INTRAVENOUS | Status: DC

## 2018-06-03 NOTE — Progress Notes (Signed)
Called to room to assist during endoscopic procedure.  Patient ID and intended procedure confirmed with present staff. Received instructions for my participation in the procedure from the performing physician.  

## 2018-06-03 NOTE — Op Note (Signed)
[facility] [facility] Patient Name: Micheal Hines Procedure Date: 06/03/2018 11:10 AM MRN: [id number] Endoscopist: Ladene Artist , MD Age: 43 Referring MD:  Date of Birth: [birth year] Gender: Male Account #: [id number] Procedure:                Colonoscopy Indications:              Lower abdominal pain, Hematochezia, Fecal urgency Medicines:                Monitored Anesthesia Care Procedure:                Pre-Anesthesia Assessment:                           - Prior to the procedure, a History and Physical                            was performed, and patient medications and                            allergies were reviewed. The patient's tolerance of                            previous anesthesia was also reviewed. The risks                            and benefits of the procedure and the sedation                            options and risks were discussed with the patient.                            All questions were answered, and informed consent                            was obtained. Prior Anticoagulants: The patient has                            taken no previous anticoagulant or antiplatelet                            agents. ASA Grade Assessment: II - A patient with                            mild systemic disease. After reviewing the risks                            and benefits, the patient was deemed in                            satisfactory condition to undergo the procedure.                           After obtaining informed consent, the colonoscope  was passed under direct vision. Throughout the                            procedure, the patient's blood pressure, pulse, and                            oxygen saturations were monitored continuously. The                            Model CF-HQ190L [id number]) scope was introduced                            through the anus and advanced to the the terminal                            ileum,  with identification of the appendiceal                            orifice and IC valve. The terminal ileum, ileocecal                            valve, appendiceal orifice, and rectum were                            photographed. The quality of the bowel preparation                            was excellent. The colonoscopy was performed                            without difficulty. The patient tolerated the                            procedure well. Scope In: 11:19:00 AM Scope Out: 03:00:92 AM Scope Withdrawal Time: 0 hours 14 minutes 59 seconds  Total Procedure Duration: 0 hours 17 minutes 19 seconds  Findings:                 The perianal and digital rectal examinations were                            normal.                           The terminal ileum appeared normal. Biopsies were                            taken with a cold forceps for histology.                           Three sessile polyps were found in the rectum,                            transverse colon and ascending colon. The polyps  were 5 to 8 mm in size. These polyps were removed                            with a cold snare. Resection and retrieval were                            complete.                           Internal hemorrhoids were found during                            retroflexion. The hemorrhoids were medium-sized and                            Grade III (internal hemorrhoids that prolapse but                            require manual reduction).                           The exam was otherwise normal throughout the                            examined colon. Random biopsies obtained throughout. Complications:            No immediate complications. Estimated blood loss:                            None. Estimated Blood Loss:     Estimated blood loss: none. Impression:               - The examined portion of the ileum was normal.                            Biopsied.                            - Three 5 to 8 mm polyps in the rectum, in the                            transverse colon and in the ascending colon,                            removed with a cold snare. Resected and retrieved.                           - Internal hemorrhoids. Recommendation:           - Repeat colonoscopy in 5 years for surveillance if                            polyp(s) are precancerous, otherwise 10 yearss.                           - Patient has a contact number available  for                            emergencies. The signs and symptoms of potential                            delayed complications were discussed with the                            patient. Return to normal activities tomorrow.                            Written discharge instructions were provided to the                            patient.                           - Resume previous diet.                           - Continue present medications.                           - Await pathology results.                           - Schedule hemorrhoidal banding. Ladene Artist, MD 06/03/2018 11:43:54 AM This report has been signed electronically.

## 2018-06-03 NOTE — Progress Notes (Signed)
Report given to PACU, vss 

## 2018-06-03 NOTE — Patient Instructions (Signed)
Continue present medications. Please read handouts provided. Await pathology results. Schedule hemorrhoidal banding.     YOU HAD AN ENDOSCOPIC PROCEDURE TODAY AT [facility]:   Refer to the procedure report that was given to you for any specific questions about what was found during the examination.  If the procedure report does not answer your questions, please call your gastroenterologist to clarify.  If you requested that your care partner not be given the details of your procedure findings, then the procedure report has been included in a sealed envelope for you to review at your convenience later.  YOU SHOULD EXPECT: Some feelings of bloating in the abdomen. Passage of more gas than usual.  Walking can help get rid of the air that was put into your GI tract during the procedure and reduce the bloating. If you had a lower endoscopy (such as a colonoscopy or flexible sigmoidoscopy) you may notice spotting of blood in your stool or on the toilet paper. If you underwent a bowel prep for your procedure, you may not have a normal bowel movement for a few days.  Please Note:  You might notice some irritation and congestion in your nose or some drainage.  This is from the oxygen used during your procedure.  There is no need for concern and it should clear up in a day or so.  SYMPTOMS TO REPORT IMMEDIATELY:   Following lower endoscopy (colonoscopy or flexible sigmoidoscopy):  Excessive amounts of blood in the stool  Significant tenderness or worsening of abdominal pains  Swelling of the abdomen that is new, acute  Fever of 100F or higher   For urgent or emergent issues, a gastroenterologist can be reached at any hour by calling [phone].   DIET:  We do recommend a small meal at first, but then you may proceed to your regular diet.  Drink plenty of fluids but you should avoid alcoholic beverages for 24 hours.  ACTIVITY:  You should plan to take it easy for the rest of  today and you should NOT DRIVE or use heavy machinery until tomorrow (because of the sedation medicines used during the test).    FOLLOW UP: Our staff will call the number listed on your records the next business day following your procedure to check on you and address any questions or concerns that you may have regarding the information given to you following your procedure. If we do not reach you, we will leave a message.  However, if you are feeling well and you are not experiencing any problems, there is no need to return our call.  We will assume that you have returned to your regular daily activities without incident.  If any biopsies were taken you will be contacted by phone or by letter within the next 1-3 weeks.  Please call us at [phone] if you have not heard about the biopsies in 3 weeks.    SIGNATURES/CONFIDENTIALITY: You and/or your care partner have signed paperwork which will be entered into your electronic medical record.  These signatures attest to the fact that that the information above on your After Visit Summary has been reviewed and is understood.  Full responsibility of the confidentiality of this discharge information lies with you and/or your care-partner.

## 2018-06-06 ENCOUNTER — Telehealth: Payer: Self-pay | Admitting: *Deleted

## 2018-06-06 ENCOUNTER — Telehealth: Payer: Self-pay

## 2018-06-06 NOTE — Telephone Encounter (Signed)
-----   Message from Ladene Artist, MD sent at 06/03/2018 12:19 PM EST ----- This patient needs an appt to start hemorrhoidal banding for Grade III internal hemorrhoids.  He underwent colonoscopy today.

## 2018-06-06 NOTE — Telephone Encounter (Signed)
  Follow up Call-  Call back number 06/03/2018  Post procedure Call Back phone  # [phone]  Permission to leave phone message Yes  Some recent data might be hidden     Patient questions:  Do you have a fever, pain , or abdominal swelling? No. Pain Score  0 *  Have you tolerated food without any problems? Yes.    Have you been able to return to your normal activities? Yes.    Do you have any questions about your discharge instructions: Diet   No. Medications  No. Follow up visit  No.  Do you have questions or concerns about your Care? No.  Actions: * If pain score is 4 or above: No action needed, pain <4.

## 2018-06-06 NOTE — Telephone Encounter (Signed)
Left message on f/u call 

## 2018-06-06 NOTE — Telephone Encounter (Signed)
I set him up an appointment to come 06/14/2018 at 3:45 pm for his first banding with Dr Fuller Plan.

## 2018-06-14 ENCOUNTER — Encounter: Payer: Self-pay | Admitting: Gastroenterology

## 2018-06-14 ENCOUNTER — Ambulatory Visit: Payer: BC Managed Care – PPO | Admitting: Gastroenterology

## 2018-06-14 VITALS — BP 128/82 | HR 76 | Ht 72.0 in | Wt 236.0 lb

## 2018-06-14 DIAGNOSIS — K648 Other hemorrhoids: Secondary | ICD-10-CM

## 2018-06-14 DIAGNOSIS — K642 Third degree hemorrhoids: Secondary | ICD-10-CM | POA: Diagnosis not present

## 2018-06-14 NOTE — Progress Notes (Signed)
PROCEDURE NOTE: The patient presents with symptomatic bleeding grade lll hemorrhoids, requesting rubber band ligation of their hemorrhoidal disease.  All risks, benefits and alternative forms of therapy were described and informed consent was obtained.  The anorectum was pre-medicated during digital exam with 0.125% NTG and 5% lidocaine.  In the Left Lateral Decubitus position anoscopic examination revealed grade lll hemorrhoids in the RP > RA > LL positions.   The decision was made to band the RP internal hemorrhoid, and the [place] was used to perform band ligation without complication.  Digital anorectal examination was then performed to assure proper positioning of the band and to adjust the banded tissue as required. The patient noted mild pinching pain so the band was adjusted and the pain resolved. He noted mild fullness and mild urge to defecate.  No complications were encountered and the patient tolerated the procedure well.  He experienced discomfort with initial DRE and anoscopy today. The banding and post banding DRE were better tolerated.   Dietary and behavioral recommendations were given and along with follow-up instructions.   Return for possible additional hemorrhoid banding in 2 - 3 weeks as required.   High fiber diet, Benefiber daily and at least 8 glasses of water daily. The patient was discharged home without pain or other post procedure problems.

## 2018-06-14 NOTE — Patient Instructions (Signed)

## 2018-07-05 ENCOUNTER — Encounter: Payer: Self-pay | Admitting: Gastroenterology

## 2018-07-05 ENCOUNTER — Ambulatory Visit: Payer: BC Managed Care – PPO | Admitting: Gastroenterology

## 2018-07-05 VITALS — BP 126/74 | HR 70 | Ht 72.0 in | Wt 236.4 lb

## 2018-07-05 DIAGNOSIS — K642 Third degree hemorrhoids: Secondary | ICD-10-CM

## 2018-07-05 NOTE — Progress Notes (Signed)
PROCEDURE NOTE: The patient presents with symptomatic bleeding grade 3 hemorrhoids, requesting rubber band ligation of their hemorrhoidal disease.  All risks, benefits and alternative forms of therapy were described and informed consent was obtained.   RP hemorrhoid banded 2/11 with less frequent prolapse and only 1 episode of bleeding since.   The anorectum was pre-medicated during digital exam with 0.125% NTG and 5% lidocaine. Anal discomfort noted with DRE which decreased but persisted with banding.   The decision was made to band the RA internal hemorrhoid, and the [place] was used to perform band ligation without complication.  Digital anorectal examination was then performed to assure proper positioning of the band and to adjust the banded tissue as required. No adjustment was needed.  No complications were encountered and the patient tolerated the procedure well.  Dietary and behavioral recommendations were given and along with follow-up instructions.   Return for possible additional hemorrhoid banding in 2 - 3 weeks as required.  High fiber diet, Benefiber daily and at least 8 glasses of water daily. The patient was discharged home without pain or other post procedure problems.

## 2018-07-05 NOTE — Patient Instructions (Signed)

## 2018-07-19 ENCOUNTER — Telehealth: Payer: Self-pay

## 2018-07-19 NOTE — Telephone Encounter (Signed)
Covid-19 travel screening questions  Have you traveled in the last 14 days? no If yes where?  Do you now or have you had a fever in the last 14 days? no  Do you have any respiratory symptoms of shortness of breath or cough now or in the last 14 days? no  Do you have any family members or close contacts with diagnosed or suspected Covid-19? no       

## 2018-07-20 ENCOUNTER — Other Ambulatory Visit: Payer: Self-pay

## 2018-07-20 ENCOUNTER — Ambulatory Visit: Payer: BC Managed Care – PPO | Admitting: Gastroenterology

## 2018-07-20 ENCOUNTER — Encounter: Payer: Self-pay | Admitting: Gastroenterology

## 2018-07-20 VITALS — BP 132/70 | HR 68 | Temp 97.9°F | Ht 72.0 in | Wt 237.0 lb

## 2018-07-20 DIAGNOSIS — K642 Third degree hemorrhoids: Secondary | ICD-10-CM

## 2018-07-20 DIAGNOSIS — K648 Other hemorrhoids: Secondary | ICD-10-CM | POA: Diagnosis not present

## 2018-07-20 MED ORDER — TRAMADOL HCL 50 MG PO TABS: 50.0000 mg | ORAL_TABLET | Freq: Three times a day (TID) | ORAL | 0 refills | Status: DC | PRN

## 2018-07-20 NOTE — Progress Notes (Signed)
PROCEDURE NOTE: The patient presents with symptomatic bleeding grade 3 hemorrhoids, requesting rubber band ligation of their hemorrhoidal disease.  All risks, benefits and alternative forms of therapy were described and informed consent was obtained.  RP, RA previously banded with resulting decrease in bleeding and prolapse until the last week when both symptoms returned. The RP band was potentially not effective and we discussed re-banding the RP or banding the LL. He also related rectal pain for about 6 hours the day after his last banding.   The anorectum was pre-medicated during digital exam with 0.125% NTG and 5% lidocaine. The anal canal was tender on exam before and after pre-medication.   The decision was made to repeat the banding for the LL after the patient experienced discomfort with 2 attempts to band the RP internal hemorrhoid, and the [place] was used to perform band ligation without complication.   Digital anorectal examination was then performed to assure proper positioning of the band and to adjust the banded tissue as required. No adjustment was needed.  No complications were encountered and the patient tolerated the procedure well.  Dietary and behavioral recommendations were given and along with follow-up instructions. RectiCare tid prn Prep H supp coated with 1% hydrocortisone cream pr bid for 1 week then pr bid prn Sitz baths bid for 1 week then bid prn Tylenol po tid prn Tramadol 50 mg po tid prn for pain not controlled with above, #10, no refills Discussed option to return to attempt RP banding in a few weeks Discussed option of rectal surgeon referral for further mgmt Patient to consider options and contact us   Avoid straining with bowel movements.  High fiber diet, Benefiber daily and at least 8 glasses of water daily. The patient was discharged home without pain or other post procedure problems.

## 2018-07-20 NOTE — Patient Instructions (Addendum)
We have given you a prescription of Tramadol 50 mg to take 1 tablet three times daily as needed for pain #10, no refills. Make sure not to lose this prescription as we will be unable to provide a replacement prescription.  Please purchase the following medications over the counter and take as directed: Preparation H suppositories and coat them with 1% hydrocortisone cream and use twice daily. Tylenol every 6 hours as needed for mild pain (not to exceed recommended amount per day on box) Recticare over the counter rectally three times a day.  Please use sitz baths after your hemorrhoidal banding to help with any discomfort you may have. We have given you a handout on this.  If you are age 41 or older, your body mass index should be between 23-30. Your Body mass index is 32.14 kg/m. If this is out of the aforementioned range listed, please consider follow up with your Primary Care Provider.  If you are age 71 or younger, your body mass index should be between 19-25. Your Body mass index is 32.14 kg/m. If this is out of the aformentioned range listed, please consider follow up with your Primary Care Provider.   Thank you for choosing me and [facility].  Pricilla Riffle. Dagoberto Ligas., MD., [facility] Reg[facility]  HEMORRHOID BANDING PROCEDURE    FOLLOW-UP CARE   1. The procedure you have had should have been relatively painless since the banding of the area involved does not have nerve endings and there is no pain sensation.  The rubber band cuts off the blood supply to the hemorrhoid and the band may fall off as soon as 48 hours after the banding (the band may occasionally be seen in the toilet bowl following a bowel movement). You may notice a temporary feeling of fullness in the rectum which should respond adequately to plain Tylenol or Motrin.  2. Following the banding, avoid strenuous exercise that evening and resume full activity the next day.  A sitz bath (soaking in a warm tub) or bidet is  soothing, and can be useful for cleansing the area after bowel movements.     3. To avoid constipation, take two tablespoons of natural wheat bran, natural oat bran, flax, Benefiber or any over the counter fiber supplement and increase your water intake to 7-8 glasses daily.    4. Unless you have been prescribed anorectal medication, do not put anything inside your rectum for two weeks: No suppositories, enemas, fingers, etc.  5. Occasionally, you may have more bleeding than usual after the banding procedure.  This is often from the untreated hemorrhoids rather than the treated one.  Don't be concerned if there is a tablespoon or so of blood.  If there is more blood than this, lie flat with your bottom higher than your head and apply an ice pack to the area. If the bleeding does not stop within a half an hour or if you feel faint, call our office at [phone] or go to the emergency room.  6. Problems are not common; however, if there is a substantial amount of bleeding, severe pain, chills, fever or difficulty passing urine (very rare) or other problems, you should call us at [phone] or report to the nearest emergency room.  7. Do not stay seated continuously for more than 2-3 hours for a day or two after the procedure.  Tighten your buttock muscles 10-15 times every two hours and take 10-15 deep breaths every 1-2 hours.  Do not  spend more than a few minutes on the toilet if you cannot empty your bowel; instead re-visit the toilet at a later time.

## 2018-07-21 ENCOUNTER — Encounter: Payer: BC Managed Care – PPO | Admitting: Gastroenterology

## 2018-08-12 ENCOUNTER — Emergency Department
Admission: EM | Admit: 2018-08-12 | Discharge: 2018-08-12 | Disposition: A | Discharge status: Home / Self Care | Payer: BC Managed Care – PPO | Source: Home / Self Care | Attending: Family Medicine | Admitting: Family Medicine

## 2018-08-12 ENCOUNTER — Encounter: Payer: Self-pay | Admitting: Emergency Medicine

## 2018-08-12 ENCOUNTER — Other Ambulatory Visit: Payer: Self-pay

## 2018-08-12 DIAGNOSIS — N451 Epididymitis: Secondary | ICD-10-CM | POA: Diagnosis not present

## 2018-08-12 LAB — POCT URINALYSIS DIP (MANUAL ENTRY)
Bilirubin, UA: NEGATIVE
Glucose, UA: NEGATIVE mg/dL
Leukocytes, UA: NEGATIVE
Nitrite, UA: NEGATIVE
Protein Ur, POC: NEGATIVE mg/dL
Spec Grav, UA: 1.02 (ref 1.010–1.025)
Urobilinogen, UA: 0.2 E.U./dL
pH, UA: 7 (ref 5.0–8.0)

## 2018-08-12 MED ORDER — LEVOFLOXACIN 500 MG PO TABS: 500.0000 mg | ORAL_TABLET | Freq: Every day | ORAL | 0 refills | Status: AC

## 2018-08-12 NOTE — Discharge Instructions (Addendum)
Increase fluid intake.  May take Tylenol as needed for pain. 

## 2018-08-12 NOTE — ED Triage Notes (Signed)
RT testicle pain x 3 days, movement makes it worse

## 2018-08-12 NOTE — ED Provider Notes (Signed)
[name]    CSN: [id number] Arrival date & time: 08/12/18  1607     History   Chief Complaint Chief Complaint  Patient presents with  . Testicle Pain    HPI Micheal Hines is a 43 y.o. male.   Patient developed pain in his right testicle two days ago, without swelling, warmth, or erythema of the scrotum.  He denies dysuria, urethral discharge, rash, fever, abdominal pain, etc.  He recalls no vigorous physical activity prior to onset of pain.  The pain is worse with movement, becoming worse yesterday.  He has noted that his urine has been slightly cloudy  The history is provided by the patient.  Testicle Pain  This is a new problem. The current episode started yesterday. The problem occurs constantly. The problem has been gradually worsening. Pertinent negatives include no abdominal pain. The symptoms are aggravated by walking. Nothing relieves the symptoms. He has tried nothing for the symptoms.    Past Medical History:  Diagnosis Date  . Hyperlipidemia   . Shingles   . Sleep apnea 2017   on CPAP  . Wears glasses     Patient Active Problem List   Diagnosis Date Noted  . Wrist tendonitis 04/29/2017  . OSA (obstructive sleep apnea) 06/28/2015  . Vesicles 02/17/2015  . Hyperlipidemia 01/14/2012  . Skin lesion 08/13/2011    Past Surgical History:  Procedure Laterality Date  . WISDOM TOOTH EXTRACTION         Home Medications    Prior to Admission medications   Medication Sig Start Date End Date Taking? Authorizing Provider  AMBULATORY NON FORMULARY MEDICATION CPAP device on 8 cm H2O with a Medium size Resmed Full Face Mask AirFit F20 mask and heated humidification. 07/08/15   Marcial Pacas, DO  diclofenac sodium (VOLTAREN) 1 % GEL Apply 2 g topically 4 (four) times daily. To affected joint. 04/29/17   Gregor Hams, MD  glycopyrrolate (ROBINUL) 2 MG tablet Take 1 tablet (2 mg total) by mouth 2 (two) times daily. 04/05/18   Ladene Artist, MD   ibuprofen (ADVIL,MOTRIN) 200 MG tablet Take 200 mg by mouth as needed.    [provider]  levofloxacin (LEVAQUIN) 500 MG tablet Take 1 tablet (500 mg total) by mouth daily for 10 days. 08/12/18 08/22/18  Kandra Nicolas, MD  loperamide (IMODIUM A-D) 2 MG tablet Take 1 tablet (2 mg total) by mouth 4 (four) times daily as needed for diarrhea or loose stools (Take before meals and bedtime). 08/20/16   [name], DO  Multiple Vitamin (MULTIVITAMIN) capsule Take 1 capsule by mouth daily.    [provider]  SUMAtriptan (IMITREX) 50 MG tablet Take 1 tablet (50 mg total) by mouth every 2 (two) hours as needed for migraine. May repeat in 2 hours if headache persists or recurs. 01/04/18   [name], DO  traMADol (ULTRAM) 50 MG tablet Take 1 tablet (50 mg total) by mouth 3 (three) times daily as needed. 07/20/18   Ladene Artist, MD    Family History Family History  Problem Relation Age of Onset  . Diabetes Father   . Hyperlipidemia Father   . Prostate cancer Maternal Grandfather   . Breast cancer Paternal Grandmother   . Colon cancer Neg Hx   . Esophageal cancer Neg Hx   . Rectal cancer Neg Hx   . Stomach cancer Neg Hx     Social History Social History   Tobacco Use  . Smoking status:  Never Smoker  . Smokeless tobacco: Never Used  Substance Use Topics  . Alcohol use: Yes  . Drug use: No     Allergies   Patient has no known allergies.   Review of Systems Review of Systems  Constitutional: Negative for activity change, appetite change, chills, diaphoresis, fatigue and fever.  HENT: Negative.   Eyes: Negative.   Respiratory: Negative.   Cardiovascular: Negative.   Gastrointestinal: Negative for abdominal distention and abdominal pain.  Genitourinary: Positive for testicular pain. Negative for discharge, dysuria, flank pain, frequency, genital sores, hematuria, penile pain, penile swelling, scrotal swelling and urgency.  Musculoskeletal: Negative.    Skin: Negative.   All other systems reviewed and are negative.    Physical Exam Triage Vital Signs ED Triage Vitals  Enc Vitals Group     BP 08/12/18 1636 129/81     Pulse Rate 08/12/18 1636 84     Resp --      Temp 08/12/18 1636 98.4 F (36.9 C)     Temp Source 08/12/18 1636 Oral     SpO2 08/12/18 1636 98 %     Weight 08/12/18 1637 235 lb (106.6 kg)     Height 08/12/18 1637 6' (1.829 m)     Head Circumference --      Peak Flow --      Pain Score 08/12/18 1637 3     Pain Loc --      Pain Edu? --      Excl. in [place]? --    No data found.  Updated Vital Signs BP 129/81 (BP Location: Right Arm)   Pulse 84   Temp 98.4 F (36.9 C) (Oral)   Ht 6' (1.829 m)   Wt 106.6 kg   SpO2 98%   BMI 31.87 kg/m   Visual Acuity Right Eye Distance:   Left Eye Distance:   Bilateral Distance:    Right Eye Near:   Left Eye Near:    Bilateral Near:     Physical Exam Nursing notes and Vital Signs reviewed. Appearance:  Patient appears stated age, and in no acute distress.    Eyes:  Pupils are equal, round, and reactive to light and accomodation.  Extraocular movement is intact.  Conjunctivae are not inflamed   Pharynx:  Normal; moist mucous membranes  Neck:  Supple.  No adenopathy Lungs:  Clear to auscultation.  Breath sounds are equal.  Moving air well. Heart:  Regular rate and rhythm without murmurs, rubs, or gallops.  Abdomen:  Nontender without masses or hepatosplenomegaly.  Bowel sounds are present.  No CVA or flank tenderness.  Genitourinary:  Penis normal without lesions or urethral discharge.  Scrotum is normal.  Testes are descended bilaterally without nodules.  There is tenderness to palpation over the right epididymus.  Cremasteric reflex is present bilaterally.  No hernias are palpated.  No regional lymphadenopathy palpated  Extremities:  No edema.  Skin:  No rash present.      UC Treatments / Results  Labs (all labs ordered are listed, but only abnormal results are  displayed) Labs Reviewed  POCT URINALYSIS DIP (MANUAL ENTRY) - Abnormal; Notable for the following components:      Result Value   Color, UA other (*)    Clarity, UA cloudy (*)    Ketones, POC UA trace (5) (*)    Blood, UA trace-lysed (*)    All other components within normal limits  URINE CULTURE    EKG None  Radiology No results found.  Procedures Procedures (including critical care time)  Medications Ordered in UC Medications - No data to display  Initial Impression / Assessment and Plan / UC Course  I have reviewed the triage vital signs and the nursing notes.  Pertinent labs & imaging results that were available during my care of the patient were reviewed by me and considered in my medical decision making (see chart for details).    Urine culture pending.  Begin Levaquin 500mg  daily for 10 days. Followup with urologist if not improving about 4 to 5 days. If symptoms become significantly worse during the night or over the weekend, proceed to the local emergency room.    Final Clinical Impressions(s) / UC Diagnoses   Final diagnoses:  Epididymitis     Discharge Instructions     Increase fluid intake.  May take Tylenol as needed for pain.    ED Prescriptions    Medication Sig Dispense Auth. Provider   levofloxacin (LEVAQUIN) 500 MG tablet Take 1 tablet (500 mg total) by mouth daily for 10 days. 10 tablet Kandra Nicolas, MD         Kandra Nicolas, MD 08/18/18 [phone]

## 2018-08-14 LAB — URINE CULTURE
MICRO NUMBER:: 390570
Result:: NO GROWTH
SPECIMEN QUALITY:: ADEQUATE

## 2018-08-15 ENCOUNTER — Telehealth: Payer: Self-pay

## 2018-08-15 NOTE — Telephone Encounter (Signed)
Still having pain and discomfort, not as bad.  Will follow up as needed.

## 2018-10-27 ENCOUNTER — Emergency Department (HOSPITAL_BASED_OUTPATIENT_CLINIC_OR_DEPARTMENT_OTHER): Payer: BC Managed Care – PPO

## 2018-10-27 ENCOUNTER — Other Ambulatory Visit: Payer: Self-pay

## 2018-10-27 ENCOUNTER — Encounter (HOSPITAL_BASED_OUTPATIENT_CLINIC_OR_DEPARTMENT_OTHER): Payer: Self-pay | Admitting: Emergency Medicine

## 2018-10-27 ENCOUNTER — Emergency Department (HOSPITAL_BASED_OUTPATIENT_CLINIC_OR_DEPARTMENT_OTHER)
Admission: EM | Admit: 2018-10-27 | Discharge: 2018-10-27 | Disposition: A | Discharge status: Home / Self Care | Payer: BC Managed Care – PPO | Attending: Emergency Medicine | Admitting: Emergency Medicine

## 2018-10-27 DIAGNOSIS — R3129 Other microscopic hematuria: Secondary | ICD-10-CM | POA: Diagnosis not present

## 2018-10-27 DIAGNOSIS — R61 Generalized hyperhidrosis: Secondary | ICD-10-CM | POA: Diagnosis not present

## 2018-10-27 DIAGNOSIS — R109 Unspecified abdominal pain: Secondary | ICD-10-CM | POA: Diagnosis not present

## 2018-10-27 DIAGNOSIS — R1031 Right lower quadrant pain: Secondary | ICD-10-CM | POA: Diagnosis present

## 2018-10-27 LAB — URINALYSIS, MICROSCOPIC (REFLEX)

## 2018-10-27 LAB — URINALYSIS, ROUTINE W REFLEX MICROSCOPIC
Bilirubin Urine: NEGATIVE
Glucose, UA: NEGATIVE mg/dL
Ketones, ur: NEGATIVE mg/dL
Leukocytes,Ua: NEGATIVE
Nitrite: NEGATIVE
Protein, ur: NEGATIVE mg/dL
Specific Gravity, Urine: >1.03 — ABNORMAL HIGH (ref 1.005–1.030)
pH: 6 (ref 5.0–8.0)

## 2018-10-27 MED ORDER — KETOROLAC TROMETHAMINE 10 MG PO TABS: 10.0000 mg | ORAL_TABLET | Freq: Four times a day (QID) | ORAL | 0 refills | Status: DC | PRN

## 2018-10-27 MED ORDER — OXYCODONE-ACETAMINOPHEN 5-325 MG PO TABS: 1.0000 | ORAL_TABLET | Freq: Four times a day (QID) | ORAL | 0 refills | Status: DC | PRN

## 2018-10-27 MED ORDER — KETOROLAC TROMETHAMINE 30 MG/ML IJ SOLN
30.0000 mg | Freq: Once | INTRAMUSCULAR | Status: AC
  Administered 2018-10-27: 30 mg via INTRAMUSCULAR
  Filled 2018-10-27: qty 1

## 2018-10-27 MED FILL — KETOROLAC 10 MG TABLET: 10 | 5 days supply | Qty: 20 | Fill #0

## 2018-10-27 MED FILL — OXYCODONE-ACETAMINOPHEN 5-3: 5-325 | 2 days supply | Qty: 8 | Fill #0

## 2018-10-27 NOTE — ED Triage Notes (Signed)
Right flank pain that radiates toward RLQ started upon arising this morning.

## 2018-10-27 NOTE — ED Provider Notes (Signed)
Emergency Department Provider Note   I have reviewed the triage vital signs and the nursing notes.   HISTORY  Chief Complaint Flank Pain   HPI Micheal Hines is a 43 y.o. male with PMH of HLD and shingles who presents to the emergency department for evaluation of severe, sudden onset right flank pain radiating around to the right lower quadrant.  Patient states he awoke this morning with severe pain which felt like "stabbing" in the right middle back.  He did not have the urge to urinate when he awoke.  When he did urinate he did not notice any blood.  He has not experienced any fevers or chills.  He did have some sweating during an episode of severe pain.  He describes it as a dull ache currently but will intermittently get sharp and severe.  He took ibuprofen this morning and feels that is helping somewhat.  No vomiting or diarrhea.  Denies any chest pain or shortness of breath.  No history of kidney stone or abdominal surgery.   Past Medical History:  Diagnosis Date  . Hyperlipidemia   . Shingles   . Sleep apnea 2017   on CPAP  . Wears glasses     Patient Active Problem List   Diagnosis Date Noted  . Wrist tendonitis 04/29/2017  . OSA (obstructive sleep apnea) 06/28/2015  . Vesicles 02/17/2015  . Hyperlipidemia 01/14/2012  . Skin lesion 08/13/2011    Past Surgical History:  Procedure Laterality Date  . WISDOM TOOTH EXTRACTION      Allergies Patient has no known allergies.  Family History  Problem Relation Age of Onset  . Diabetes Father   . Hyperlipidemia Father   . Prostate cancer Maternal Grandfather   . Breast cancer Paternal Grandmother   . Colon cancer Neg Hx   . Esophageal cancer Neg Hx   . Rectal cancer Neg Hx   . Stomach cancer Neg Hx     Social History Social History   Tobacco Use  . Smoking status: Never Smoker  . Smokeless tobacco: Never Used  Substance Use Topics  . Alcohol use: Yes    Comment: rarely  . Drug use: No    Review of  Systems  Constitutional: No fever/chills Eyes: No visual changes. ENT: No sore throat. Cardiovascular: Denies chest pain. Respiratory: Denies shortness of breath. Gastrointestinal: Positive right flank/abdominal pain.  No nausea, no vomiting.  No diarrhea.  No constipation. Genitourinary: Negative for dysuria. Musculoskeletal: Right mid back pain.  Skin: Negative for rash. Neurological: Negative for headaches, focal weakness or numbness.  10-point ROS otherwise negative.  ____________________________________________   PHYSICAL EXAM:  VITAL SIGNS: ED Triage Vitals  Enc Vitals Group     BP 10/27/18 0752 124/79     Pulse Rate 10/27/18 0752 (!) 59     Resp 10/27/18 0752 18     Temp 10/27/18 0752 99.9 F (37.7 C)     Temp Source 10/27/18 0752 Oral     SpO2 10/27/18 0752 99 %     Weight 10/27/18 0753 235 lb (106.6 kg)     Height 10/27/18 0753 6' (1.829 m)   Constitutional: Alert and oriented. Well appearing and in no acute distress. Eyes: Conjunctivae are normal.  Head: Atraumatic. Nose: No congestion/rhinnorhea. Mouth/Throat: Mucous membranes are moist.  Neck: No stridor.   Cardiovascular: Normal rate, regular rhythm. Good peripheral circulation. Grossly normal heart sounds.   Respiratory: Normal respiratory effort.  No retractions. Lungs CTAB. Gastrointestinal: Soft and nontender. No distention.  Musculoskeletal: No lower extremity tenderness nor edema. No gross deformities of extremities. Neurologic:  Normal speech and language. No gross focal neurologic deficits are appreciated.  Skin:  Skin is warm, dry and intact. No rash noted.  ____________________________________________   LABS (all labs ordered are listed, but only abnormal results are displayed)  Labs Reviewed  URINALYSIS, ROUTINE W REFLEX MICROSCOPIC - Abnormal; Notable for the following components:      Result Value   Specific Gravity, Urine >1.030 (*)    Hgb urine dipstick MODERATE (*)    All other  components within normal limits  URINALYSIS, MICROSCOPIC (REFLEX) - Abnormal; Notable for the following components:   Bacteria, UA RARE (*)    All other components within normal limits   ____________________________________________  RADIOLOGY  CT renal reviewed. No stone seen.  ____________________________________________   PROCEDURES  Procedure(s) performed:   Procedures  None ____________________________________________   INITIAL IMPRESSION / ASSESSMENT AND PLAN / ED COURSE  Pertinent labs & imaging results that were available during my care of the patient were reviewed by me and considered in my medical decision making (see chart for details).   Patient presents to the emergency department for evaluation of right flank pain radiating around to the right lower quadrant.  No anterior abdominal tenderness to suspect acute appendicitis or other abdominal pathology.  High degree of clinical suspicion for nephrolithiasis.  Plan for UA along with culture and CT renal.   CT renal reviewed. No stone visualized. Question recently passed stone. Labs reassuring without evidence of infection. Plan for supportive care at home and f/u with Urology with concern for underlying stone disease and hematuria of unknown origin.  ____________________________________________  FINAL CLINICAL IMPRESSION(S) / ED DIAGNOSES  Final diagnoses:  Right flank pain  Other microscopic hematuria     MEDICATIONS GIVEN DURING THIS VISIT:  Medications  ketorolac (TORADOL) 30 MG/ML injection 30 mg (30 mg Intramuscular Given 10/27/18 0817)     NEW OUTPATIENT MEDICATIONS STARTED DURING THIS VISIT:  Discharge Medication List as of 10/27/2018  8:51 AM    START taking these medications   Details  ketorolac (TORADOL) 10 MG tablet Take 1 tablet (10 mg total) by mouth every 6 (six) hours as needed., Starting Thu 10/27/2018, Normal    oxyCODONE-acetaminophen (PERCOCET/ROXICET) 5-325 MG tablet Take 1 tablet by  mouth every 6 (six) hours as needed for severe pain., Starting Thu 10/27/2018, Normal        Note:  This document was prepared using Dragon voice recognition software and may include unintentional dictation errors.  Nanda Quinton, MD Emergency Medicine    , [facility], MD 10/29/18 [phone]

## 2018-10-27 NOTE — Discharge Instructions (Signed)
You were seen in the emergency department today with right flank pain and blood in the urine.  While we did not find a kidney stone on your CT scan I suspect that a kidney stone is the cause of your pain.  It may be very small or have recently passed.  Take the pain medication as directed.  Follow-up with the urologist if your pain continues.  If you develop fever or pain that is unable to be controlled by the above medications you should return to the emergency department for evaluation.

## 2018-12-19 IMAGING — DX DG CHEST 2V
2 series · 2 of 2 positions shown · non-contrast
Comparison: None.

CLINICAL DATA: Patient c/o cough, congestion, low grade fever,
aches and hoarseness progressive worsening over past 4 days.

EXAM:
CHEST - 2 VIEW

[chest pa]
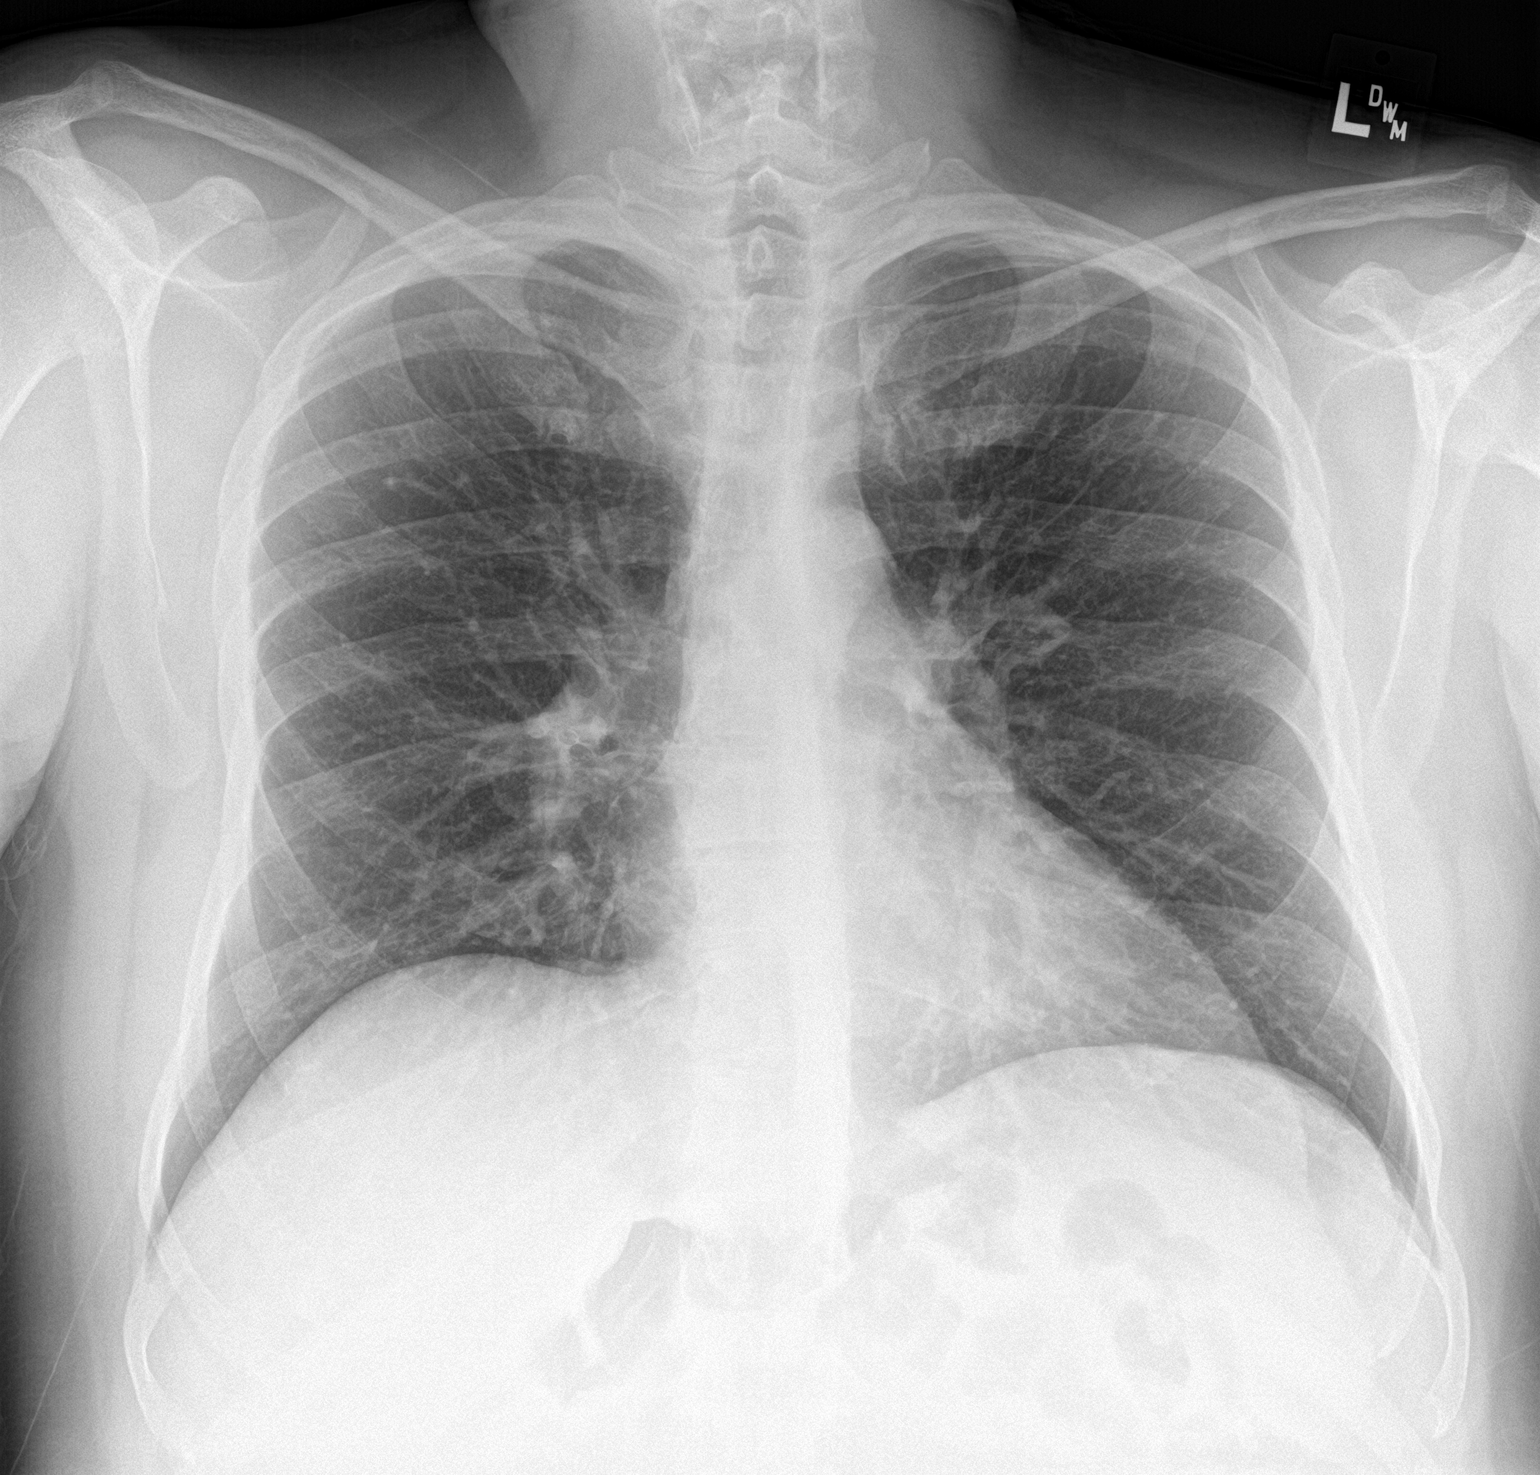

[chest lat]
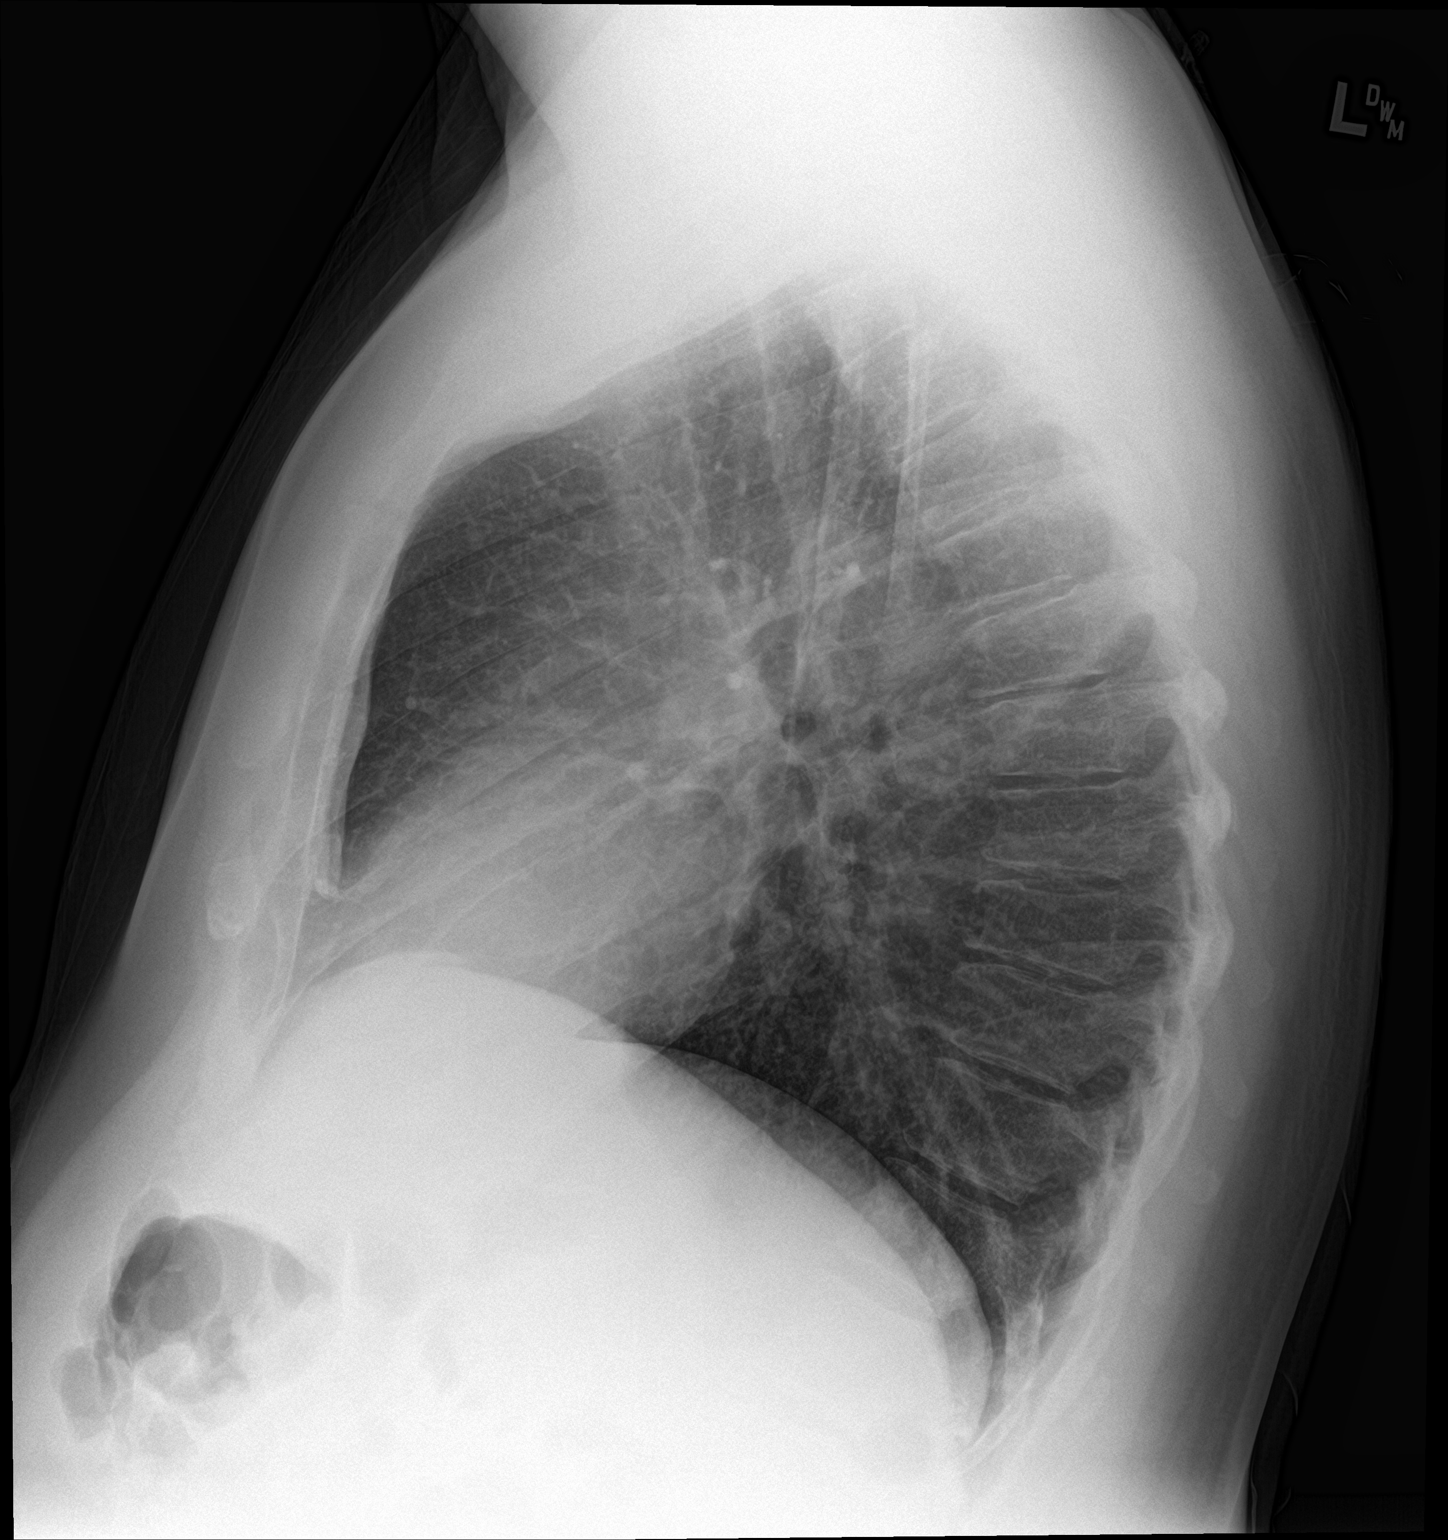

[2 of 2 positions shown; findings below may reference images not displayed]

FINDINGS: The heart size and mediastinal contours are within normal limits.
Both lungs are clear. The visualized skeletal structures are
unremarkable.
IMPRESSION: No active cardiopulmonary disease.  No evidence of pneumonia.

## 2019-09-24 ENCOUNTER — Emergency Department
Admission: EM | Admit: 2019-09-24 | Discharge: 2019-09-24 | Disposition: A | Discharge status: Home / Self Care | Payer: BC Managed Care – PPO | Source: Home / Self Care | Attending: Family Medicine | Admitting: Family Medicine

## 2019-09-24 ENCOUNTER — Other Ambulatory Visit: Payer: Self-pay

## 2019-09-24 DIAGNOSIS — R1030 Lower abdominal pain, unspecified: Secondary | ICD-10-CM

## 2019-09-24 LAB — POCT URINALYSIS DIP (MANUAL ENTRY)
Bilirubin, UA: NEGATIVE
Blood, UA: NEGATIVE
Glucose, UA: NEGATIVE mg/dL
Ketones, POC UA: NEGATIVE mg/dL
Leukocytes, UA: NEGATIVE
Nitrite, UA: NEGATIVE
Protein Ur, POC: NEGATIVE mg/dL
Spec Grav, UA: 1.02 (ref 1.010–1.025)
Urobilinogen, UA: 1 E.U./dL
pH, UA: 7.5 (ref 5.0–8.0)

## 2019-09-24 NOTE — ED Provider Notes (Signed)
[facility]    CSN: [id number] Arrival date & time: 09/24/19  1121      History   Chief Complaint Chief Complaint  Patient presents with  . Groin Pain    HPI Micheal Hines is a 44 y.o. male.   Patient complains of three day history of vague migratory pain in his right testicle and groin area.  The pain does not remain consistently in one spot.  He describes the pain as ranging from dull to shooting.  He denies urethral discharge, dysuria, and rash.  He feels well otherwise.  He denies injury, but admits that he has been doing a lot of yard work recently and also has some mild lower back ache.    Groin Pain Episode onset: 3 days ago. The problem occurs daily. The problem has not changed since onset.Pertinent negatives include no abdominal pain. The symptoms are aggravated by twisting. Nothing relieves the symptoms. He has tried nothing for the symptoms.    Past Medical History:  Diagnosis Date  . Hyperlipidemia   . Shingles   . Sleep apnea 2017   on CPAP  . Wears glasses     Patient Active Problem List   Diagnosis Date Noted  . Wrist tendonitis 04/29/2017  . OSA (obstructive sleep apnea) 06/28/2015  . Vesicles 02/17/2015  . Hyperlipidemia 01/14/2012  . Skin lesion 08/13/2011    Past Surgical History:  Procedure Laterality Date  . WISDOM TOOTH EXTRACTION         Home Medications    Prior to Admission medications   Medication Sig Start Date End Date Taking? Authorizing Provider  AMBULATORY NON FORMULARY MEDICATION CPAP device on 8 cm H2O with a Medium size Resmed Full Face Mask AirFit F20 mask and heated humidification. 07/08/15   Marcial Pacas, DO  diclofenac sodium (VOLTAREN) 1 % GEL Apply 2 g topically 4 (four) times daily. To affected joint. 04/29/17   Gregor Hams, MD  glycopyrrolate (ROBINUL) 2 MG tablet Take 1 tablet (2 mg total) by mouth 2 (two) times daily. 04/05/18   Ladene Artist, MD  ketorolac (TORADOL) 10 MG tablet Take 1 tablet (10  mg total) by mouth every 6 (six) hours as needed. 10/27/18   Long, [facility], MD  loperamide (IMODIUM A-D) 2 MG tablet Take 1 tablet (2 mg total) by mouth 4 (four) times daily as needed for diarrhea or loose stools (Take before meals and bedtime). 08/20/16   [name], DO  Multiple Vitamin (MULTIVITAMIN) capsule Take 1 capsule by mouth daily.    [provider]  oxyCODONE-acetaminophen (PERCOCET/ROXICET) 5-325 MG tablet Take 1 tablet by mouth every 6 (six) hours as needed for severe pain. 10/27/18   Long, [facility], MD  SUMAtriptan (IMITREX) 50 MG tablet Take 1 tablet (50 mg total) by mouth every 2 (two) hours as needed for migraine. May repeat in 2 hours if headache persists or recurs. 01/04/18   [name], DO  traMADol (ULTRAM) 50 MG tablet Take 1 tablet (50 mg total) by mouth 3 (three) times daily as needed. 07/20/18   Ladene Artist, MD    Family History Family History  Problem Relation Age of Onset  . Diabetes Father   . Hyperlipidemia Father   . Prostate cancer Maternal Grandfather   . Breast cancer Paternal Grandmother   . Colon cancer Neg Hx   . Esophageal cancer Neg Hx   . Rectal cancer Neg Hx   . Stomach cancer Neg Hx  Social History Social History   Tobacco Use  . Smoking status: Never Smoker  . Smokeless tobacco: Never Used  Substance Use Topics  . Alcohol use: Yes    Comment: rarely  . Drug use: No     Allergies   Patient has no known allergies.   Review of Systems Review of Systems  Constitutional: Negative for activity change, appetite change, chills, diaphoresis, fatigue and fever.  Gastrointestinal: Negative for abdominal pain, constipation, diarrhea and nausea.  Genitourinary: Positive for testicular pain. Negative for decreased urine volume, difficulty urinating, discharge, dysuria, flank pain, frequency, genital sores, hematuria, penile pain, penile swelling, scrotal swelling and urgency.  Skin: Negative.   All other systems  reviewed and are negative.    Physical Exam Triage Vital Signs ED Triage Vitals  Enc Vitals Group     BP 09/24/19 1140 (!) 148/90     Pulse Rate 09/24/19 1140 82     Resp 09/24/19 1140 18     Temp 09/24/19 1140 99 F (37.2 C)     Temp Source 09/24/19 1140 Oral     SpO2 09/24/19 1140 98 %     Weight --      Height --      Head Circumference --      Peak Flow --      Pain Score 09/24/19 1141 2     Pain Loc --      Pain Edu? --      Excl. in [place]? --    No data found.  Updated Vital Signs BP (!) 148/90 (BP Location: Left Arm)   Pulse 82   Temp 99 F (37.2 C) (Oral)   Resp 18   SpO2 98%   Visual Acuity Right Eye Distance:   Left Eye Distance:   Bilateral Distance:    Right Eye Near:   Left Eye Near:    Bilateral Near:     Physical Exam Nursing notes and Vital Signs reviewed. Appearance:  Patient appears stated age, and in no acute distress.    Eyes:  Pupils are equal, round, and reactive to light and accomodation.  Extraocular movement is intact.  Conjunctivae are not inflamed   Pharynx:  Normal; moist mucous membranes  Neck:  Supple.  No adenopathy Lungs:  Clear to auscultation.  Breath sounds are equal.  Moving air well. Heart:  Regular rate and rhythm without murmurs, rubs, or gallops.  Abdomen:  Nontender without masses or hepatosplenomegaly.  Bowel sounds are present.  No CVA or flank tenderness.  Extremities:  No edema.  No tenderness to palpation over right hip flexors. Skin:  No rash present.  Genitourinary:  Penis normal without lesions or urethral discharge.  Scrotum is normal.  Testes are descended bilaterally without nodules or tenderness.  No hernias are palpated.  No regional lymphadenopathy palpated.  Cremasteric reflex is present.    UC Treatments / Results  Labs (all labs ordered are listed, but only abnormal results are displayed) Labs Reviewed  POCT URINALYSIS DIP (MANUAL ENTRY) - Abnormal; Notable for the following components:      Result  Value   Clarity, UA cloudy (*)    All other components within normal limits  URINE CULTURE    EKG   Radiology No results found.  Procedures Procedures (including critical care time)  Medications Ordered in UC Medications - No data to display  Initial Impression / Assessment and Plan / UC Course  I have reviewed the triage vital signs and the nursing  notes.  Pertinent labs & imaging results that were available during my care of the patient were reviewed by me and considered in my medical decision making (see chart for details).    Benign exam.  Urine culture pending. If symptoms persist, follow-up with urologist.   Final Clinical Impressions(s) / UC Diagnoses   Final diagnoses:  Inguinal pain, unspecified laterality     Discharge Instructions     Increase fluid intake. Ibuprofen 200mg , 4 tabs every 8 hours with food.     ED Prescriptions    None        Kandra Nicolas, MD 09/25/19 1614

## 2019-09-24 NOTE — Discharge Instructions (Addendum)
Increase fluid intake. Ibuprofen 200mg , 4 tabs every 8 hours with food.

## 2019-09-24 NOTE — ED Triage Notes (Signed)
Pt c/o groin/testicular pain. Says it started in the RT testicle and pain tends to move. Pain is intermittent, ranges from dull sensation, sometimes shooting. Ibuprofen prn.

## 2019-09-25 LAB — URINE CULTURE
MICRO NUMBER:: 10510955
Result:: NO GROWTH
SPECIMEN QUALITY:: ADEQUATE

## 2020-01-11 ENCOUNTER — Telehealth (INDEPENDENT_AMBULATORY_CARE_PROVIDER_SITE_OTHER): Payer: BC Managed Care – PPO | Admitting: Osteopathic Medicine

## 2020-01-11 ENCOUNTER — Encounter: Payer: Self-pay | Admitting: Osteopathic Medicine

## 2020-01-11 DIAGNOSIS — Z20822 Contact with and (suspected) exposure to covid-19: Secondary | ICD-10-CM

## 2020-01-11 DIAGNOSIS — R059 Cough, unspecified: Secondary | ICD-10-CM

## 2020-01-11 DIAGNOSIS — J029 Acute pharyngitis, unspecified: Secondary | ICD-10-CM | POA: Diagnosis not present

## 2020-01-11 DIAGNOSIS — N50812 Left testicular pain: Secondary | ICD-10-CM

## 2020-01-11 DIAGNOSIS — N50811 Right testicular pain: Secondary | ICD-10-CM | POA: Diagnosis not present

## 2020-01-11 DIAGNOSIS — R05 Cough: Secondary | ICD-10-CM

## 2020-01-11 NOTE — Progress Notes (Signed)
Virtual Visit via Video (App used: Doximity) Note  I connected with      Micheal Hines on 01/11/20 at 10:12 AM  by a telemedicine application and verified that I am speaking with the correct person using two identifiers.  Patient is at home I am in office   I discussed the limitations of evaluation and management by telemedicine and the availability of in person appointments. The patient expressed understanding and agreed to proceed.  History of Present Illness: Micheal Hines is a 44 y.o. male who would like to discuss sore throat, concern for COVID    Started 01/09/20 Dry throat Back of nose felt raw Coughing No fever, no loss taste/smell   Today 01/11/20  Throat feels ok Nose feels raw Still coughing   Son just started kindergarten so he is concerned about COVID exposure, son seems ok for now.   No allergy history   Other issue: past weekend on Labor day, upper part of groin and moved into the scrotum. Also had testicular pain earlier this year. Would like referral to urology.     Observations/Objective: There were no vitals taken for this visit. BP Readings from Last 3 Encounters:  09/24/19 (!) 148/90  10/27/18 124/79  08/12/18 129/81   Exam: Normal Speech.  NAD  Lab and Radiology Results No results found for this or any previous visit (from the past 72 hour(s)). No results found.     Assessment and Plan: 44 y.o. male with The primary encounter diagnosis was Pain in both testicles. Diagnoses of Cough, Sore throat, and Suspected COVID-19 virus infection were also pertinent to this visit.   PDMP not reviewed this encounter. Orders Placed This Encounter  Procedures  . Novel Coronavirus, NAA ([facility])    Order Specific Question:   Is this test for diagnosis or screening    Answer:   Diagnosis of ill patient    Order Specific Question:   Symptomatic for COVID-19 as defined by CDC    Answer:   Yes    Order Specific Question:   Date of Symptom  Onset    Answer:   01/09/2020    Order Specific Question:   Hospitalized for COVID-19    Answer:   No    Order Specific Question:   Admitted to ICU for COVID-19    Answer:   No    Order Specific Question:   Previously tested for COVID-19    Answer:   No    Order Specific Question:   Resident in a congregate (group) care setting    Answer:   No    Order Specific Question:   Is the patient student?    Answer:   No    Order Specific Question:   Employed in healthcare setting    Answer:   No    Order Specific Question:   Has patient completed COVID vaccination(s) (2 doses of Pfizer/Moderna 1 dose of [employer])    Answer:   Yes  . Ambulatory referral to Urology    Referral Priority:   Routine    Referral Type:   Consultation    Referral Reason:   Specialty Services Required    Requested Specialty:   Urology    Number of Visits Requested:   1   No orders of the defined types were placed in this encounter.  Patient Instructions  Over-the-Counter Medications & Home Remedies for Upper Respiratory Illness  Note: the following list assumes no pregnancy, normal liver &  kidney function and no other drug interactions. Dr. Sheppard Coil has highlighted medications which are safe for you to use, but these may not be appropriate for everyone. Always ask a pharmacist or qualified medical provider if you have any questions!   Aches/Pains, Fever, Headache Acetaminophen (Tylenol) 500 mg tablets - take max 2 tablets (1000 mg) every 6 hours (4 times per day)  Ibuprofen (Motrin) 200 mg tablets - take max 4 tablets (800 mg) every 6 hours*  Sinus Congestion Nasal Saline if desired Oxymetolazone (Afrin, others) sparing use due to rebound congestion, NEVER use in kids Phenylephrine (Sudafed) 10 mg tablets every 4 hours (or the 12-hour formulation)* Diphenhydramine (Benadryl) 25 mg tablets - take max 2 tablets every 4 hours  Cough & Sore Throat Dextromethorphan (Robitussin, others) - cough  suppressant Guaifenesin (Robitussin, Mucinex, others) - expectorant (helps cough up mucus) (Dextromethorphan and Guaifenesin also come in a combination tablet) Lozenges w/ Benzocaine + Menthol (Cepacol) Honey - as much as you want! Teas which "coat the throat" - look for ingredients Elm Bark, Licorice Root, Marshmallow Root  Other Zinc Lozenges within 24 hours of symptoms onset - mixed evidence this shortens the duration of the common cold Don't waste your money on Vitamin C or Echinacea  *Caution in patients with high blood pressure      Instructions sent via MyChart. If MyChart not available, pt was given option for info via personal e-mail w/ no guarantee of protected health info over unsecured e-mail communication, and MyChart sign-up instructions were sent to patient.   Follow Up Instructions: Return if symptoms worsen or fail to improve / depending on results.    I discussed the assessment and treatment plan with the patient. The patient was provided an opportunity to ask questions and all were answered. The patient agreed with the plan and demonstrated an understanding of the instructions.   The patient was advised to call back or seek an in-person evaluation if any new concerns, if symptoms worsen or if the condition fails to improve as anticipated.  30 minutes of non-face-to-face time was provided during this encounter.      . . . . . . . . . . . . . Marland Kitchen                   Historical information moved to improve visibility of documentation.  Past Medical History:  Diagnosis Date  . Hyperlipidemia   . Shingles   . Sleep apnea 2017   on CPAP  . Wears glasses    Past Surgical History:  Procedure Laterality Date  . WISDOM TOOTH EXTRACTION     Social History   Tobacco Use  . Smoking status: Never Smoker  . Smokeless tobacco: Never Used  Substance Use Topics  . Alcohol use: Yes    Comment: rarely   family history includes Breast  cancer in his paternal grandmother; Diabetes in his father; Hyperlipidemia in his father; Prostate cancer in his maternal grandfather.  Medications: Current Outpatient Medications  Medication Sig Dispense Refill  . Multiple Vitamin (MULTIVITAMIN) capsule Take 1 capsule by mouth daily.    . AMBULATORY NON FORMULARY MEDICATION CPAP device on 8 cm H2O with a Medium size Resmed Full Face Mask AirFit F20 mask and heated humidification. (Patient not taking: Reported on 01/11/2020) 1 Units 0  . diclofenac sodium (VOLTAREN) 1 % GEL Apply 2 g topically 4 (four) times daily. To affected joint. (Patient not taking: Reported on 01/11/2020) 100 g 11  . glycopyrrolate (  ROBINUL) 2 MG tablet Take 1 tablet (2 mg total) by mouth 2 (two) times daily. (Patient not taking: Reported on 01/11/2020) 60 tablet 11  . ketorolac (TORADOL) 10 MG tablet Take 1 tablet (10 mg total) by mouth every 6 (six) hours as needed. (Patient not taking: Reported on 01/11/2020) 20 tablet 0  . loperamide (IMODIUM A-D) 2 MG tablet Take 1 tablet (2 mg total) by mouth 4 (four) times daily as needed for diarrhea or loose stools (Take before meals and bedtime). (Patient not taking: Reported on 01/11/2020) 90 tablet 1  . oxyCODONE-acetaminophen (PERCOCET/ROXICET) 5-325 MG tablet Take 1 tablet by mouth every 6 (six) hours as needed for severe pain. (Patient not taking: Reported on 01/11/2020) 8 tablet 0  . SUMAtriptan (IMITREX) 50 MG tablet Take 1 tablet (50 mg total) by mouth every 2 (two) hours as needed for migraine. May repeat in 2 hours if headache persists or recurs. (Patient not taking: Reported on 01/11/2020) 20 tablet 0  . traMADol (ULTRAM) 50 MG tablet Take 1 tablet (50 mg total) by mouth 3 (three) times daily as needed. (Patient not taking: Reported on 01/11/2020) 10 tablet 0   No current facility-administered medications for this visit.   No Known Allergies

## 2020-01-12 NOTE — Patient Instructions (Signed)
Over-the-Counter Medications & Home Remedies for Upper Respiratory Illness  Note: the following list assumes no pregnancy, normal liver & kidney function and no other drug interactions. Dr. Sheppard Coil has highlighted medications which are safe for you to use, but these may not be appropriate for everyone. Always ask a pharmacist or qualified medical provider if you have any questions!   Aches/Pains, Fever, Headache Acetaminophen (Tylenol) 500 mg tablets - take max 2 tablets (1000 mg) every 6 hours (4 times per day)  Ibuprofen (Motrin) 200 mg tablets - take max 4 tablets (800 mg) every 6 hours*  Sinus Congestion Nasal Saline if desired Oxymetolazone (Afrin, others) sparing use due to rebound congestion, NEVER use in kids Phenylephrine (Sudafed) 10 mg tablets every 4 hours (or the 12-hour formulation)* Diphenhydramine (Benadryl) 25 mg tablets - take max 2 tablets every 4 hours  Cough & Sore Throat Dextromethorphan (Robitussin, others) - cough suppressant Guaifenesin (Robitussin, Mucinex, others) - expectorant (helps cough up mucus) (Dextromethorphan and Guaifenesin also come in a combination tablet) Lozenges w/ Benzocaine + Menthol (Cepacol) Honey - as much as you want! Teas which "coat the throat" - look for ingredients Elm Bark, Licorice Root, Marshmallow Root  Other Zinc Lozenges within 24 hours of symptoms onset - mixed evidence this shortens the duration of the common cold Don't waste your money on Vitamin C or Echinacea  *Caution in patients with high blood pressure

## 2020-01-14 LAB — NOVEL CORONAVIRUS, NAA: SARS-CoV-2, NAA: NOT DETECTED

## 2020-01-14 LAB — SPECIMEN STATUS REPORT

## 2020-09-04 ENCOUNTER — Ambulatory Visit (INDEPENDENT_AMBULATORY_CARE_PROVIDER_SITE_OTHER): Payer: BC Managed Care – PPO | Admitting: Osteopathic Medicine

## 2020-09-04 ENCOUNTER — Encounter: Payer: Self-pay | Admitting: Osteopathic Medicine

## 2020-09-04 ENCOUNTER — Other Ambulatory Visit: Payer: Self-pay

## 2020-09-04 VITALS — BP 136/90 | HR 72 | Temp 98.6°F | Wt 229.0 lb

## 2020-09-04 DIAGNOSIS — G4733 Obstructive sleep apnea (adult) (pediatric): Secondary | ICD-10-CM

## 2020-09-04 DIAGNOSIS — Z23 Encounter for immunization: Secondary | ICD-10-CM

## 2020-09-04 DIAGNOSIS — Z0000 Encounter for general adult medical examination without abnormal findings: Secondary | ICD-10-CM

## 2020-09-04 DIAGNOSIS — Z8669 Personal history of other diseases of the nervous system and sense organs: Secondary | ICD-10-CM | POA: Diagnosis not present

## 2020-09-04 DIAGNOSIS — E785 Hyperlipidemia, unspecified: Secondary | ICD-10-CM

## 2020-09-04 NOTE — Progress Notes (Signed)
Micheal Hines is a 45 y.o. male who presents to  [place] [facility]  today, 09/04/20, seeking care for the following:  . Annual physical      ASSESSMENT & PLAN with other pertinent findings:  The primary encounter diagnosis was Annual physical exam. Diagnoses of OSA (obstructive sleep apnea), Hyperlipidemia, unspecified hyperlipidemia type, History of sleep apnea, and Need for Tdap vaccination were also pertinent to this visit.   1. Annual physical exam See below  2. OSA (obstructive sleep apnea) Using and benefiting from CPAP device  3. Hyperlipidemia, unspecified hyperlipidemia type  4. History of sleep apnea See #2  5. Need for Tdap vaccination Done today   Patient Instructions  General Preventive Care  Most recent routine screening labs: ordered.   Blood pressure goal 130/80 or less.   Tobacco: don't!   Alcohol: responsible moderation is ok for most adults - if you have concerns about your alcohol intake, please talk to me!   Exercise: as tolerated to reduce risk of cardiovascular disease and diabetes. Strength training will also prevent osteoporosis.   Mental health: if need for mental health care (medicines, counseling, other), or concerns about moods, please let me know!   Sexual / Reproductive health: if need for STI testing, or if concerns with libido/pain problems, please let me know! If you need to discuss family planning, please let me know!   Advanced Directive: Living Will and/or Healthcare Power of Attorney recommended for all adults, regardless of age or health.  Vaccines  Flu vaccine: for almost everyone, every fall.   Shingles vaccine: after age 68.   Pneumonia vaccines: after age 75  Tetanus booster: every 10 years   COVID vaccine: THANKS for getting your vaccine! BOOSTER STRONGLY RECOMMENDED  Cancer screenings   Colon cancer screening: for everyone age 30-75.   Prostate cancer  screening: PSA blood test age 64-71  Lung cancer screening: not needed for non-smokers  Infection screenings  . HIV: recommended screening at least once age 71-65 . Gonorrhea/Chlamydia and other STI: screening as needed . Hepatitis C: recommended once for everyone age 52-75 . TB: certain at-risk populations Other . Bone Density Test: recommended for men at age 46 . Abdominal Aortic Aneurysm: screening with ultrasound recommended once for men age 65-75 who have ever smoked   Orders Placed This Encounter  Procedures  . Tdap vaccine greater than or equal to 7yo IM  . CBC  . COMPLETE METABOLIC PANEL WITH GFR  . Lipid panel    No orders of the defined types were placed in this encounter.    See below for relevant physical exam findings  See below for recent lab and imaging results reviewed  Medications, allergies, PMH, PSH, SocH, [place] reviewed below    Follow-up instructions: Return in about 1 year (around 09/04/2021) for [place] (call week prior to visit for lab orders).                                        Exam:  BP 136/90 (BP Location: Left Arm, Patient Position: Sitting, Cuff Size: Normal)   Pulse 72   Temp 98.6 F (37 C) (Oral)   Wt 229 lb 0.6 oz (103.9 kg)   BMI 31.06 kg/m   Constitutional: VS see above. General Appearance: alert, well-developed, well-nourished, NAD  Neck: No masses, trachea midline.   Respiratory: Normal respiratory effort.  no wheeze, no rhonchi, no rales  Cardiovascular: S1/S2 normal, no murmur, no rub/gallop auscultated. RRR.   Musculoskeletal: Gait normal. Symmetric and independent movement of all extremities  Abdominal: non-tender, non-distended, no appreciable organomegaly, neg Murphy's, BS WNLx4  Neurological: Normal balance/coordination. No tremor.  Skin: warm, dry, intact.   Psychiatric: Normal judgment/insight. Normal mood and affect. Oriented x3.   Current Meds  Medication Sig  . diclofenac  sodium (VOLTAREN) 1 % GEL Apply 2 g topically 4 (four) times daily. To affected joint.  Marland Kitchen loperamide (IMODIUM A-D) 2 MG tablet Take 1 tablet (2 mg total) by mouth 4 (four) times daily as needed for diarrhea or loose stools (Take before meals and bedtime).  . Multiple Vitamin (MULTIVITAMIN) capsule Take 1 capsule by mouth daily.    No Known Allergies  Patient Active Problem List   Diagnosis Date Noted  . Wrist tendonitis 04/29/2017  . OSA (obstructive sleep apnea) 06/28/2015  . Vesicles 02/17/2015  . Hyperlipidemia 01/14/2012  . Skin lesion 08/13/2011    Family History  Problem Relation Age of Onset  . Diabetes Father   . Hyperlipidemia Father   . Prostate cancer Maternal Grandfather   . Breast cancer Paternal Grandmother   . Colon cancer Neg Hx   . Esophageal cancer Neg Hx   . Rectal cancer Neg Hx   . Stomach cancer Neg Hx     Social History   Tobacco Use  Smoking Status Never Smoker  Smokeless Tobacco Never Used    Past Surgical History:  Procedure Laterality Date  . WISDOM TOOTH EXTRACTION      Immunization History  Administered Date(s) Administered  . Influenza Whole 01/07/2011  . Influenza,inj,Quad PF,6+ Mos 01/19/2013, 04/29/2017, 01/04/2018  . Janssen (J&J) SARS-COV-2 Vaccination 07/14/2019  . Tdap 12/04/2010, 09/04/2020    No results found for this or any previous visit (from the past 2160 hour(s)).  No results found.     All questions at time of visit were answered - patient instructed to contact office with any additional concerns or updates. ER/RTC precautions were reviewed with the patient as applicable.   Please note: manual typing as well as voice recognition software may have been used to produce this document - typos may escape review. Please contact Dr. Sheppard Coil for any needed clarifications.

## 2020-09-04 NOTE — Patient Instructions (Addendum)
General Preventive Care  Most recent routine screening labs: ordered.   Blood pressure goal 130/80 or less.   Tobacco: don't!   Alcohol: responsible moderation is ok for most adults - if you have concerns about your alcohol intake, please talk to me!   Exercise: as tolerated to reduce risk of cardiovascular disease and diabetes. Strength training will also prevent osteoporosis.   Mental health: if need for mental health care (medicines, counseling, other), or concerns about moods, please let me know!   Sexual / Reproductive health: if need for STI testing, or if concerns with libido/pain problems, please let me know! If you need to discuss family planning, please let me know!   Advanced Directive: Living Will and/or Healthcare Power of Attorney recommended for all adults, regardless of age or health.  Vaccines  Flu vaccine: for almost everyone, every fall.   Shingles vaccine: after age 34.   Pneumonia vaccines: after age 78  Tetanus booster: every 10 years   COVID vaccine: THANKS for getting your vaccine! BOOSTER STRONGLY RECOMMENDED  Cancer screenings   Colon cancer screening: for everyone age 80-75.   Prostate cancer screening: PSA blood test age 26-71  Lung cancer screening: not needed for non-smokers  Infection screenings  . HIV: recommended screening at least once age 22-65 . Gonorrhea/Chlamydia and other STI: screening as needed . Hepatitis C: recommended once for everyone age 11-75 . TB: certain at-risk populations Other . Bone Density Test: recommended for men at age 19 . Abdominal Aortic Aneurysm: screening with ultrasound recommended once for men age 57-75 who have ever smoked

## 2020-09-05 ENCOUNTER — Telehealth: Payer: Self-pay | Admitting: Osteopathic Medicine

## 2020-09-05 MED ORDER — AMBULATORY NON FORMULARY MEDICATION: 99 refills | Status: DC

## 2020-09-05 NOTE — Telephone Encounter (Signed)
Needs new CPAP machine can we arrange this - please print recent annual physical note and sleep study report if able!

## 2020-09-07 LAB — LIPID PANEL
Cholesterol: 192 mg/dL (ref <200)
HDL: 37 mg/dL — ABNORMAL LOW (ref >=40)
LDL Cholesterol (Calc): 130 mg/dL (calc) — ABNORMAL HIGH
Non-HDL Cholesterol (Calc): 155 mg/dL (calc) — ABNORMAL HIGH (ref <130)
Total CHOL/HDL Ratio: 5.2 (calc) — ABNORMAL HIGH (ref <5.0)
Triglycerides: 140 mg/dL (ref <150)

## 2020-09-07 LAB — COMPLETE METABOLIC PANEL WITH GFR
AG Ratio: 1.8 (calc) (ref 1.0–2.5)
ALT: 24 U/L (ref 9–46)
AST: 20 U/L (ref 10–40)
Albumin: 4.4 g/dL (ref 3.6–5.1)
Alkaline phosphatase (APISO): 49 U/L (ref 36–130)
BUN: 13 mg/dL (ref 7–25)
CO2: 21 mmol/L (ref 20–32)
Calcium: 9.5 mg/dL (ref 8.6–10.3)
Chloride: 107 mmol/L (ref 98–110)
Creat: 1.12 mg/dL (ref 0.60–1.35)
GFR, Est African American: 93 mL/min/{1.73_m2} (ref >=60)
GFR, Est Non African American: 81 mL/min/{1.73_m2} (ref >=60)
Globulin: 2.5 g/dL (calc) (ref 1.9–3.7)
Glucose, Bld: 81 mg/dL (ref 65–99)
Potassium: 4.3 mmol/L (ref 3.5–5.3)
Sodium: 142 mmol/L (ref 135–146)
Total Bilirubin: 1.3 mg/dL — ABNORMAL HIGH (ref 0.2–1.2)
Total Protein: 6.9 g/dL (ref 6.1–8.1)

## 2020-09-07 LAB — CBC
HCT: 46.4 % (ref 38.5–50.0)
Hemoglobin: 15.9 g/dL (ref 13.2–17.1)
MCH: 30.8 pg (ref 27.0–33.0)
MCHC: 34.3 g/dL (ref 32.0–36.0)
MCV: 89.7 fL (ref 80.0–100.0)
MPV: 10.9 fL (ref 7.5–12.5)
Platelets: 188 10*3/uL (ref 140–400)
RBC: 5.17 10*6/uL (ref 4.20–5.80)
RDW: 12 % (ref 11.0–15.0)
WBC: 6.3 10*3/uL (ref 3.8–10.8)

## 2020-09-11 NOTE — Telephone Encounter (Signed)
Task completed. Available study report dated 07/06/2015 printed with last office notes. Placed in provider's box.

## 2020-09-13 ENCOUNTER — Other Ambulatory Visit: Payer: Self-pay | Admitting: Osteopathic Medicine

## 2020-09-13 MED ORDER — AMBULATORY NON FORMULARY MEDICATION: 99 refills | Status: DC

## 2020-09-13 NOTE — Telephone Encounter (Signed)
Rx signed and attached, palced in assistant's inbasket, thanks!

## 2020-09-16 ENCOUNTER — Encounter: Payer: Self-pay | Admitting: Osteopathic Medicine

## 2020-09-16 DIAGNOSIS — F32A Depression, unspecified: Secondary | ICD-10-CM

## 2020-09-16 DIAGNOSIS — H9190 Unspecified hearing loss, unspecified ear: Secondary | ICD-10-CM

## 2020-10-31 ENCOUNTER — Other Ambulatory Visit: Payer: Self-pay

## 2020-10-31 ENCOUNTER — Ambulatory Visit (INDEPENDENT_AMBULATORY_CARE_PROVIDER_SITE_OTHER): Payer: BC Managed Care – PPO | Admitting: Mental Health

## 2020-10-31 DIAGNOSIS — F4323 Adjustment disorder with mixed anxiety and depressed mood: Secondary | ICD-10-CM

## 2020-10-31 NOTE — Progress Notes (Signed)
[facility] Initial Adult Exam  Name: Micheal Hines Date: 10/31/2020 MRN: [id number] DOB: [birth year] PCP: [name], DO  Time spent: 53 minutes  Reason for Visit /Presenting Problem: patient reports he wants to get his "temper under control". Finds himself "snapping" at his son (age 43). He son will run on the couch, after redirection. His wife tells him "pick your battles".  He wants to not get as upset, raise voice/yell. His wife may get frustrated when he cannot hear well, he may hear her say "you, go do this" but she tells him he is not hearing her correctly.  He stated she is working to not get upset when he cannot hear her. She is learning that she get closer and look toward him.  Feels he is depressed and it comes out as anger. He struggles w/ motivation to find anything to enjoy for himself. After he puts his son to bed, he may play video games but less for a while.  Saw an audiologist recently, his hearing was intact. Later he learned he had audio processing potentially. Feels he has been coping for year w/ this challenge.   Mental Status Exam:    Appearance:    Casual     Behavior:   Appropriate  Motor:   WNL  Speech/Language:    Clear and Coherent  Affect:   Full range   Mood:   Pleasant, depressed  Thought process:   normal  Thought content:     WNL  Sensory/Perceptual disturbances:     none  Orientation:   x4  Attention:   Good  Concentration:   Good  Memory:   Intact  Fund of knowledge:    Consistent with age and development  Insight:     Good  Judgment:    Good  Impulse Control:   Good     Reported Symptoms:  depressed mood, anhedonia, irritability   Risk Assessment: Danger to Self:  No Self-injurious Behavior: No Danger to Others: No Duty to Warn:no Physical Aggression / Violence:No  Access to Firearms a concern: No  Gang Involvement:No  Patient / guardian was educated about steps to take if suicide or homicide risk level increases between  visits: yes While future psychiatric events cannot be accurately predicted, the patient does not currently require acute inpatient psychiatric care and does not currently meet [facility] involuntary commitment criteria.  Substance Abuse History: Current substance abuse: No     Past Psychiatric History:   Outpatient Providers: therapy in late adolescence, early adulthood History of Psych Hospitalization: No  Psychological Testing: none  Family History:  Family History  Problem Relation Age of Onset   Diabetes Father    Hyperlipidemia Father    Prostate cancer Maternal Grandfather    Breast cancer Paternal Grandmother    Colon cancer Neg Hx    Esophageal cancer Neg Hx    Rectal cancer Neg Hx    Stomach cancer Neg Hx    Past Medical History:  Diagnosis Date   Hyperlipidemia    Shingles    Sleep apnea 2017   on CPAP   Wears glasses     Past Surgical History:  Procedure Laterality Date   WISDOM TOOTH EXTRACTION      Medications: Current Outpatient Medications  Medication Sig Dispense Refill   AMBULATORY NON FORMULARY MEDICATION Supply ordered: CPAP and other supplies needed (headgear to fit, cushions, filters, heated tubing and water chamber) Dx: obstructive sleep apnea Settings: auto-titration 5-20 cmH2O 1 Units 99  diclofenac sodium (VOLTAREN) 1 % GEL Apply 2 g topically 4 (four) times daily. To affected joint. 100 g 11   loperamide (IMODIUM A-D) 2 MG tablet Take 1 tablet (2 mg total) by mouth 4 (four) times daily as needed for diarrhea or loose stools (Take before meals and bedtime). 90 tablet 1   Multiple Vitamin (MULTIVITAMIN) capsule Take 1 capsule by mouth daily.     SUMAtriptan (IMITREX) 50 MG tablet Take 1 tablet (50 mg total) by mouth every 2 (two) hours as needed for migraine. May repeat in 2 hours if headache persists or recurs. (Patient not taking: No sig reported) 20 tablet 0   No current facility-administered medications for this visit.    No Known  Allergies  Diagnoses:    ICD-10-CM   1. Adjustment disorder with mixed anxiety and depressed mood  F43.23       Plan of Care: TBD   [place], [facility]

## 2020-11-18 ENCOUNTER — Other Ambulatory Visit: Payer: Self-pay

## 2020-11-19 ENCOUNTER — Telehealth (INDEPENDENT_AMBULATORY_CARE_PROVIDER_SITE_OTHER): Payer: BC Managed Care – PPO | Admitting: Physician Assistant

## 2020-11-19 ENCOUNTER — Encounter: Payer: Self-pay | Admitting: Physician Assistant

## 2020-11-19 VITALS — Temp 97.5°F | Ht 72.0 in | Wt 229.0 lb

## 2020-11-19 DIAGNOSIS — U071 COVID-19: Secondary | ICD-10-CM | POA: Insufficient documentation

## 2020-11-19 HISTORY — DX: COVID-19: U07.1

## 2020-11-19 MED ORDER — NIRMATRELVIR/RITONAVIR (PAXLOVID)TABLET: 3.0000 | ORAL_TABLET | Freq: Two times a day (BID) | ORAL | 0 refills | Status: AC

## 2020-11-19 NOTE — Patient Instructions (Signed)
Nirmatrelvir; Ritonavir Oral Tablets What is this medication? NIRMATRELVIR; RITONAVIR (nir ma trel vir; ri TOE na veer) treats COVID-19. It is an antiviral medication. It may decrease the risk of developing severe symptoms of COVID-19. It may also decrease the chance of going to the hospital. This medication is not approved by the FDA. The FDA has authorized emergencyuse of this medication during the COVID-19 pandemic. This medicine may be used for other purposes; ask your health care provider orpharmacist if you have questions. COMMON BRAND NAME(S): PAXLOVID What should I tell my care team before I take this medication? They need to know if you have any of these conditions: Any allergies Any serious illness Kidney disease Liver disease An unusual or allergic reaction to nirmatrelvir, ritonavir, other medications, foods, dyes, or preservatives Pregnant or trying to get pregnant Breast-feeding How should I use this medication? Take this medication by mouth with water. Take it as directed on the prescription label at the same time every day. Swallow the tablets whole. You can take it with or without food. If it upsets your stomach, take it with food. Take all of this medication unless your care team tells you to stop it early.Keep taking it even if you think you are better. Talk to your care team about the use of this medication in children. While it may be prescribed for children as young as 12 years for selected conditions,precautions do apply. Overdosage: If you think you have taken too much of this medicine contact apoison control center or emergency room at once. NOTE: This medicine is only for you. Do not share this medicine with others. What if I miss a dose? If you miss a dose, take it as soon as you can unless it is more than 8 hours late. If it is more than 8 hours late, skip the missed dose. Take the next dose at the normal time. Do not take extra or 2 doses at the same time to make  upfor the missed dose. What may interact with this medication? Do not take this medication with any of the following medications: Alfuzosin Certain medications for anxiety or sleep like midazolam, triazolam Certain medications for cancer like apalutamide, enzalutamide Certain medications for cholesterol like lovastatin, simvastatin Certain medications for irregular heart beat like amiodarone, dronedarone, flecainide, propafenone, quinidine Certain medications for pain like meperidine, piroxicam, propoxyphene Certain medications for psychotic disorders like clozapine, lurasidone, pimozide Certain medications for seizures like carbamazepine, phenobarbital, phenytoin Colchicine Ergot alkaloids like dihydroergotamine, ergonovine, ergotamine, methylergonovine Ranolazine Rifampin Sildenafil St. John's wort This medication may also interact with the following medications: Bedaquiline Birth control pills Bosentan Certain antibiotics like erythromycin or clarithromycin Certain medications for blood pressure like amlodipine, diltiazem, felodipine, nicardipine, nifedipine Certain medications for cancer like abemaciclib, ceritinib, dasatinib, encorafenib, ibrutinib, ivosidenib, neratinib, nilotinib, venetoclax, vinblastine, vincristine Certain medications for cholesterol like atorvastatin, rosuvastatin Certain medications for depression like bupropion, trazodone Certain medications for fungal infections like isavuconazonium, itraconazole, ketoconazole, voriconazole Certain medications for hepatitis C like elbasvir; grazoprevir, dasabuvir; ombitasvir; paritaprevir; ritonavir, glecaprevir; pibrentasvir, sofosbuvir; velpatasvir; voxilaprevir Certain medications for HIV or AIDS Certain medications for irregular heartbeat like bepridil, lidocaine Certain medications that treat or prevent blood clots like rivaroxaban, warfarin Digoxin Fentanyl Medications that lower your chance of fighting infection  like cyclosporine, sirolimus, tacrolimus Methadone Quetiapine Rifabutin Salmeterol Steroid medications like budesonide, dexamethasone, fluticasone, prednisone, triamcinolone This list may not describe all possible interactions. Give your health care provider a list of all the medicines, herbs, non-prescription drugs, or dietary supplements  you use. Also tell them if you smoke, drink alcohol, or use illegaldrugs. Some items may interact with your medicine. What should I watch for while using this medication? Your condition will be monitored carefully while you are receiving this medication. Visit your care team for regular checkups. Tell your care team ifyour symptoms do not start to get better or if they get worse. If you have untreated HIV infection, this medication may lead to some HIVmedications not working as well in the future. Birth control may not work properly while you are taking this medication. Talkto your care team about using an extra method of birth control. What side effects may I notice from receiving this medication? Side effects that you should report to your care team as soon as possible: Allergic reactions-skin rash, itching, hives, swelling of the face, lips, tongue, or throat Liver injury-right upper belly pain, loss of appetite, nausea, light-colored stool, dark yellow or brown urine, yellowing skin or eyes, unusual weakness or fatigue Side effects that usually do not require medical attention (report these toyour care team if they continue or are bothersome): Change in taste Diarrhea Increase in blood pressure Muscle pain This list may not describe all possible side effects. Call your doctor for medical advice about side effects. You may report side effects to FDA at1-800-FDA-1088. Where should I keep my medication? Keep out of the reach of children and pets. Store at room temperature between 20 and 25 degrees C (68 and 77 degrees F).Get rid of any unused medication  after the expiration date. To get rid of medications that are no longer needed or have expired: Take the medication to a medication take-back program. Check with your pharmacy or law enforcement to find a location. If you cannot return the medication, check the label or package insert to see if the medication should be thrown out in the garbage or flushed down the toilet. If you are not sure, ask your care team. If it is safe to put it in the trash, take the medication out of the container. Mix the medication with cat litter, dirt, coffee grounds, or other unwanted substance. Seal the mixture in a bag or container. Put it in the trash. NOTE: This sheet is a summary. It may not cover all possible information. If you have questions about this medicine, talk to your doctor, pharmacist, orhealth care provider.  2022 Elsevier/Gold Standard (2020-04-29 17:41:26)

## 2020-11-19 NOTE — Progress Notes (Signed)
Covid positive 11/15/2020 Symptoms started Wednesday Sore throat, raw feeling in throat/back of nose Fatigue Weakness Congestion Fevers  Taking Tylenol, Thera Flu, alka seltzer cold

## 2020-11-19 NOTE — Progress Notes (Signed)
Telemedicine Visit via  Video & Audio (App used: Chartered loss adjuster) Audio only - telephone (patient preference /  technical difficulty with MyChart video application)  I connected with Micheal Hines on 11/19/20 at 10:20 AM  by phone or  telemedicine application as noted above  I verified that I am speaking with or regarding  the correct patient using two identifiers.  Participants: Myself, Micheal Chimes PA-C Patient: Micheal Hines   Patient is at home in [facility] I am in office at [facility]    I discussed the limitations of evaluation and management  by telemedicine and the availability of in person appointments.  The participant(s) above expressed understanding and  agreed to proceed with this appointment via telemedicine.       History of Present Illness: Micheal Hines is a 45 y.o. male who would like to discuss COVID-19 infection   Symptoms started Thurs 7/14, tested + on 7/15 Endorses severe fatigue, sore throat, nasal congestion, dry cough, sneezing, waxing and waning fevers He denies chest pain, DOE/SOB, blood-tinged sputum, faintness or syncope He has received J&J and Moderna booster>6 months ago      Observations/Objective: Temp (!) 97.5 F (36.4 C) (Oral)   Ht 6' (1.829 m)   Wt 229 lb (103.9 kg)   BMI 31.06 kg/m  BP Readings from Last 3 Encounters:  09/04/20 136/90  09/24/19 (!) 148/90  10/27/18 124/79   Exam limited by telehealth Gen: alert, appears fatigued, non-toxic appearing Pulm: speaking in full sentences Psych: normal thought content, normal behavior, normal speech  Lab and Radiology Results No results found for this or any previous visit (from the past 72 hour(s)). No results found.   Lab Results  Component Value Date   CREATININE 1.12 09/06/2020   BUN 13 09/06/2020   NA 142 09/06/2020   K 4.3 09/06/2020   CL 107 09/06/2020   CO2 21 09/06/2020   Lab Results  Component Value Date   ALT 24 09/06/2020   AST 20  09/06/2020   ALKPHOS 49 08/20/2016   BILITOT 1.3 (H) 09/06/2020     Assessment and Plan: 45 y.o. male with The encounter diagnosis was COVID-19 virus infection.   Reviewed risks, benefits and potential adverse reactions to Paxlovid - patient is a candidate for antiviral therapy based on BMI and is on day 5 of symptoms, performed med rec to ensure no interactions Personally reviewed last renal function dated 09/06/20 - GFR 81 Continue supportive care with OTC Theraflu and fever reducers as needed, relative rest and push fluids Counseled on ER precautions Follow-up prn   PDMP not reviewed this encounter. No orders of the defined types were placed in this encounter.  No orders of the defined types were placed in this encounter.  There are no Patient Instructions on file for this visit.  Instructions sent via MyChart.   Follow Up Instructions: No follow-ups on file.    I discussed the assessment and treatment plan with the patient. The patient was provided an opportunity to ask questions and all were answered. The patient agreed with the plan and demonstrated an understanding of the instructions.   The patient was advised to call back or seek an in-person evaluation if any new concerns, if symptoms worsen or if the condition fails to improve as anticipated.  20 minutes of non-face-to-face time was provided during this encounter.      . . . . . . . . . . . . . Marland Kitchen  Historical information moved to improve visibility of documentation.  Past Medical History:  Diagnosis Date   Hyperlipidemia    Shingles    Sleep apnea 2017   on CPAP   Wears glasses    Past Surgical History:  Procedure Laterality Date   WISDOM TOOTH EXTRACTION     Social History   Tobacco Use   Smoking status: Never   Smokeless tobacco: Never  Substance Use Topics   Alcohol use: Yes    Comment: rarely   family history includes Breast cancer in his  paternal grandmother; Diabetes in his father; Hyperlipidemia in his father; Prostate cancer in his maternal grandfather.  Medications: Current Outpatient Medications  Medication Sig Dispense Refill   AMBULATORY NON FORMULARY MEDICATION Supply ordered: CPAP and other supplies needed (headgear to fit, cushions, filters, heated tubing and water chamber) Dx: obstructive sleep apnea Settings: auto-titration 5-20 cmH2O 1 Units 99   diclofenac sodium (VOLTAREN) 1 % GEL Apply 2 g topically 4 (four) times daily. To affected joint. 100 g 11   Multiple Vitamin (MULTIVITAMIN) capsule Take 1 capsule by mouth daily.     No current facility-administered medications for this visit.   No Known Allergies   If phone visit, billing and coding can please add appropriate modifier if needed

## 2020-12-04 ENCOUNTER — Other Ambulatory Visit: Payer: Self-pay

## 2020-12-04 ENCOUNTER — Ambulatory Visit (INDEPENDENT_AMBULATORY_CARE_PROVIDER_SITE_OTHER): Payer: BC Managed Care – PPO | Admitting: Mental Health

## 2020-12-04 DIAGNOSIS — F4323 Adjustment disorder with mixed anxiety and depressed mood: Secondary | ICD-10-CM | POA: Diagnosis not present

## 2020-12-04 NOTE — Patient Instructions (Signed)
[id number] 

## 2020-12-04 NOTE — Progress Notes (Signed)
[facility] Psychotherapy Note  Name: Micheal Hines Date: 12/04/2020 MRN: [id number] DOB: 07/28/1975 PCP: [name], DO  Time spent: 50 minutes  Treatment:  individual therapy  Mental Status Exam:    Appearance:    Casual     Behavior:   Appropriate  Motor:   WNL  Speech/Language:    Clear and Coherent  Affect:   Full range   Mood:   Pleasant, depressed  Thought process:   normal  Thought content:     WNL  Sensory/Perceptual disturbances:     none  Orientation:   x4  Attention:   Good  Concentration:   Good  Memory:   Intact  Fund of knowledge:    Consistent with age and development  Insight:     Good  Judgment:    Good  Impulse Control:   Good     Reported Symptoms:  depressed mood, anhedonia, irritability   Risk Assessment: Danger to Self:  No Self-injurious Behavior: No Danger to Others: No Duty to Warn:no Physical Aggression / Violence:No  Access to Firearms a concern: No  Gang Involvement:No  Patient / guardian was educated about steps to take if suicide or homicide risk level increases between visits: yes While future psychiatric events cannot be accurately predicted, the patient does not currently require acute inpatient psychiatric care and does not currently meet [facility] involuntary commitment criteria.  Substance Abuse History: Current substance abuse: No     Past Psychiatric History:   Outpatient Providers: therapy in late adolescence, early adulthood History of Psych Hospitalization: No  Psychological Testing: none  Family History:  Family History  Problem Relation Age of Onset   Diabetes Father    Hyperlipidemia Father    Prostate cancer Maternal Grandfather    Breast cancer Paternal Grandmother    Colon cancer Neg Hx    Esophageal cancer Neg Hx    Rectal cancer Neg Hx    Stomach cancer Neg Hx    Past Medical History:  Diagnosis Date   Hyperlipidemia    Shingles    Sleep apnea 2017   on CPAP   Wears glasses      Past Surgical History:  Procedure Laterality Date   WISDOM TOOTH EXTRACTION      Medications: Current Outpatient Medications  Medication Sig Dispense Refill   AMBULATORY NON FORMULARY MEDICATION Supply ordered: CPAP and other supplies needed (headgear to fit, cushions, filters, heated tubing and water chamber) Dx: obstructive sleep apnea Settings: auto-titration 5-20 cmH2O 1 Units 99   diclofenac sodium (VOLTAREN) 1 % GEL Apply 2 g topically 4 (four) times daily. To affected joint. 100 g 11   Multiple Vitamin (MULTIVITAMIN) capsule Take 1 capsule by mouth daily.     No current facility-administered medications for this visit.    Family History:  Raised by both parents. Stated his father had anger issues but stated they have "worked through it".  Sister- age 36    Family History  Problem Relation Age of Onset   Diabetes Father    Hyperlipidemia Father    Prostate cancer Maternal Grandfather    Breast cancer Paternal Grandmother    Colon cancer Neg Hx    Esophageal cancer Neg Hx    Rectal cancer Neg Hx    Stomach cancer Neg Hx      Living situation: the patient lives w/ wife and 70 y/o son  Sexual Orientation:  Straight  Relationship Status: married x9 years Name of spouse / other: Micheal Hines  If a parent, number of children / ages: 6y/o son- Ship broker; spouse  Museum/gallery curator Stress:  No   Income/Employment/Disability: Employment  Armed forces logistics/support/administrative officer: No   Educational History: Education: Oceanographer:    Stressors: interpersonal, family related  Strengths:  Family and Friends  Barriers:  none  Legal History: Pending legal issue / charges: none History of legal issue / charges: none  Subjective:  Patient resents on time for today's session. Continued to gather history completing part two of the assessment with patient. He shared recent challenges, how the air conditioner went out at their home. It went on to share  some examples of how he continues to get frustrated with some of his son's behavior, jumping around on furniture in the house as an example, where he continues to want to work on his communication, apparent effectively with less frustration, agitation.  He shared how he and his wife are not on the "same page" with their parenting at times. Assessed more family history relating to his childhood and adolescence, the strained relationship he had at times with his father. He stated he was raised by both parents. Stated his father had anger issues but stated they have "worked through it". In discussing some examples where he shared getting upset w/ his son at home, allowing himself to go to another room to calm himself, while enlisting his wife to help in these moments when possible.    Diagnoses:    ICD-10-CM   1. Adjustment disorder with mixed anxiety and depressed mood  F43.23        Plan: Patient is to use CBT, mindfulness and coping skills to help manage decrease symptoms associated with their diagnosis.  Patient continues to embrace ways to take steps toward his independence and self acceptance.  Patient to utilize his support system during this time due to the recent passing of his friend.   Long-term goal:   Reduce overall level, frequency, and intensity of the feelings of emotional distress that inadvertently affects patient's mood, functioning and relationships for at least 3 consecutive months oer patient report.    Short-term goal:  Decrease "deflating, self-doubting" and catastrophizing thinking style Decrease anxiety producing self talk such as thinking of the worse possible life outcome Decrease ruminating, especially at night which can affect his sleep   Assessment of progress:  progressing   This record has been created using [employer].  Chart creation errors have been sought, but may not always have been located and corrected. Such creation errors do not reflect on the  standard of medical care.    Anson [place], [facility]

## 2020-12-30 ENCOUNTER — Ambulatory Visit (INDEPENDENT_AMBULATORY_CARE_PROVIDER_SITE_OTHER): Payer: BC Managed Care – PPO | Admitting: Mental Health

## 2020-12-30 ENCOUNTER — Other Ambulatory Visit: Payer: Self-pay

## 2020-12-30 DIAGNOSIS — F4323 Adjustment disorder with mixed anxiety and depressed mood: Secondary | ICD-10-CM

## 2020-12-30 NOTE — Progress Notes (Signed)
[facility] Psychotherapy Note  Name: Micheal Hines Date: 12/30/2020 MRN: [id number] DOB: Apr 15, 1976 PCP: Micheal Reeve, DO  Time spent: 51 minutes  Treatment:  individual therapy  Mental Status Exam:    Appearance:    Casual     Behavior:   Appropriate  Motor:   WNL  Speech/Language:    Clear and Coherent  Affect:   Full range   Mood:   Pleasant, depressed  Thought process:   normal  Thought content:     WNL  Sensory/Perceptual disturbances:     none  Orientation:   x4  Attention:   Good  Concentration:   Good  Memory:   Intact  Fund of knowledge:    Consistent with age and development  Insight:     Good  Judgment:    Good  Impulse Control:   Good     Reported Symptoms:  depressed mood, anhedonia, irritability   Risk Assessment: Danger to Self:  No Self-injurious Behavior: No Danger to Others: No Duty to Warn:no Physical Aggression / Violence:No  Access to Firearms a concern: No  Gang Involvement:No  Patient / guardian was educated about steps to take if suicide or homicide risk level increases between visits: yes While future psychiatric events cannot be accurately predicted, the patient does not currently require acute inpatient psychiatric care and does not currently meet [facility] involuntary commitment criteria.   Subjective:  Patient resents on time for today's session.  He shared how this is the anniversary of the passing of his cat, which passed about a year ago.  Patient was very close to this pet and shared how it has been an emotional day, but feels he is doing better than he thought he would initially.  We encouraged him to allow himself to feel the emotions, recall the many positive memories he has of his pet.  He shared some recent family stress, his wife wanting him to go to a family reunion however, due to his father-in-law also planning to be present, patient told his wife that he did not want to attend.  Patient reviewed how he and  his wife stopped going to her parents house due to comments her father would make, typically trying to engage them in arguments centered around political topics.  Patient stated that his wife and son will sometimes go and visit but it is much less.  Patient stated he needed to set a boundary with not going to the family reunion as he is adamant about not being around the father-in-law as he increases his stress levels and affects his mood.  Patient plans to maintain this boundary as a way to cope and care for himself.  He stated today his son started first grade and this was memorable as he and his wife are both there to see him get on the school bus.  Diagnoses:    ICD-10-CM   1. Adjustment disorder with mixed anxiety and depressed mood  F43.23         Plan: Patient is to use CBT, mindfulness and coping skills to help manage decrease symptoms associated with their diagnosis.  Patient continues to embrace ways to take steps toward his independence and self acceptance.  Patient to utilize his support system during this time due to the recent passing of his friend.   Long-term goal:   Reduce overall level, frequency, and intensity of the feelings of emotional distress that inadvertently affects patient's mood, functioning and relationships for at least 3 consecutive  months oer patient report.    Short-term goal:  Decrease "deflating, self-doubting" and catastrophizing thinking style Decrease anxiety producing self talk such as thinking of the worse possible life outcome Decrease ruminating, especially at night which can affect his sleep   Assessment of progress:  progressing   This record has been created using [employer].  Chart creation errors have been sought, but may not always have been located and corrected. Such creation errors do not reflect on the standard of medical care.    Anson Oregon, [facility]

## 2021-01-27 ENCOUNTER — Other Ambulatory Visit: Payer: Self-pay

## 2021-01-27 ENCOUNTER — Ambulatory Visit (INDEPENDENT_AMBULATORY_CARE_PROVIDER_SITE_OTHER): Payer: BC Managed Care – PPO | Admitting: Mental Health

## 2021-01-27 DIAGNOSIS — F4323 Adjustment disorder with mixed anxiety and depressed mood: Secondary | ICD-10-CM | POA: Diagnosis not present

## 2021-01-27 NOTE — Progress Notes (Signed)
[facility] Psychotherapy Note  Name: Micheal Hines Date: 01/27/2021 MRN: [id number] DOB: Aug 04, 1975 PCP: [name], DO  Time spent: 45  minutes  Treatment:  individual therapy  Mental Status Exam:    Appearance:    Casual     Behavior:   Appropriate  Motor:   WNL  Speech/Language:    Clear and Coherent  Affect:   Full range   Mood:   Pleasant, depressed  Thought process:   normal  Thought content:     WNL  Sensory/Perceptual disturbances:     none  Orientation:   x4  Attention:   Good  Concentration:   Good  Memory:   Intact  Fund of knowledge:    Consistent with age and development  Insight:     Good  Judgment:    Good  Impulse Control:   Good     Reported Symptoms:  depressed mood, anhedonia, irritability   Risk Assessment: Danger to Self:  No Self-injurious Behavior: No Danger to Others: No Duty to Warn:no Physical Aggression / Violence:No  Access to Firearms a concern: No  Gang Involvement:No  Patient / guardian was educated about steps to take if suicide or homicide risk level increases between visits: yes While future psychiatric events cannot be accurately predicted, the patient does not currently require acute inpatient psychiatric care and does not currently meet [facility] involuntary commitment criteria.   Subjective:  Patient resents on time for today's session.  He shared recent events, progress.  He stated that his wife's cat passed away, how this is very difficult for her as she was very close to her pet.  He stated that she has been grieving over the past few weeks.  He went on to share how he continues to maintain a boundary with his father-in-law, avoiding any family functions that would involve him being around him.  Facilitated discussion regarding his managing his frustration and anger.  He stated that there were 2 instances where his son engaged in some defiant behaviors, impulsive, led patient to get frustrated.  He  continues to express wanting to manage his anger better in these situations.  Facilitated his identifying what he can remind himself of where he stated he tries to give himself time to recognize how he is feeling, have his wife step in to manage the situation which he did on 1 occasion.  Also discussed were outlets for stress management where he stated he has limited time for himself due to the demands of work and parenting but plans to follow through with identifying some opportunities for himself to relax and engage in some pleasurable interests.  Interventions: Motivational interviewing, supportive therapy, CBT  Diagnoses:    ICD-10-CM   1. Adjustment disorder with mixed anxiety and depressed mood  F43.23          Plan: Patient is to use CBT, mindfulness and coping skills to help manage decrease symptoms associated with their diagnosis.  Patient continues to embrace ways to take steps toward his independence and self acceptance.  Patient to utilize his support system during this time due to the recent passing of his friend.   Long-term goal:   Reduce overall level, frequency, and intensity of the feelings of emotional distress that inadvertently affects patient's mood, functioning and relationships for at least 3 consecutive months oer patient report.    Short-term goal:  Decrease "deflating, self-doubting" and catastrophizing thinking style Decrease anxiety producing self talk such as thinking of the worse possible life  outcome Decrease ruminating, especially at night which can affect his sleep   Assessment of progress:  progressing   This record has been created using [employer].  Chart creation errors have been sought, but may not always have been located and corrected. Such creation errors do not reflect on the standard of medical care.    Anson [place], [facility]

## 2021-02-24 ENCOUNTER — Ambulatory Visit (INDEPENDENT_AMBULATORY_CARE_PROVIDER_SITE_OTHER): Payer: BC Managed Care – PPO | Admitting: Family Medicine

## 2021-02-24 ENCOUNTER — Other Ambulatory Visit: Payer: Self-pay

## 2021-02-24 ENCOUNTER — Encounter: Payer: Self-pay | Admitting: Family Medicine

## 2021-02-24 VITALS — Temp 98.3°F

## 2021-02-24 DIAGNOSIS — Z23 Encounter for immunization: Secondary | ICD-10-CM | POA: Diagnosis not present

## 2021-02-24 NOTE — Progress Notes (Signed)
Pt here for flu shot. Afebrile,no recent illness. Vaccination given, pt tolerated well.  

## 2021-02-24 NOTE — Addendum Note (Signed)
Addended by: Teddy Spike on: 02/24/2021 04:14 PM   Modules accepted: Level of Service

## 2021-06-13 ENCOUNTER — Other Ambulatory Visit: Payer: Self-pay

## 2021-06-13 ENCOUNTER — Emergency Department
Admission: RE | Admit: 2021-06-13 | Discharge: 2021-06-13 | Disposition: A | Discharge status: Home / Self Care | Payer: BC Managed Care – PPO | Source: Ambulatory Visit

## 2021-06-13 VITALS — BP 134/92 | HR 86 | Temp 98.0°F | Resp 16 | Wt 230.0 lb

## 2021-06-13 DIAGNOSIS — H109 Unspecified conjunctivitis: Secondary | ICD-10-CM

## 2021-06-13 MED ORDER — MOXIFLOXACIN HCL 0.5 % OP SOLN: 1.0000 [drp] | Freq: Three times a day (TID) | OPHTHALMIC | 0 refills | Status: AC

## 2021-06-13 NOTE — ED Triage Notes (Signed)
Pt c/o left eye irritation and redness. Started yesterday but getting worse. Denies itching but states it feels sore and swollen.

## 2021-06-13 NOTE — Discharge Instructions (Addendum)
Advised patient to instill eyedrops into left eye as directed.  Advised patient if experiencing same symptoms in right eye may treat as well.

## 2021-06-13 NOTE — ED Provider Notes (Signed)
[facility]    CSN: [id number] Arrival date & time: 06/13/21  1554      History   Chief Complaint Chief Complaint  Patient presents with   Eye Problem    HPI Micheal Hines is a 46 y.o. male.   HPI 46 year old male presents with left eye irritation and redness started yesterday but now getting worse.  Denies itchiness or pain.  PMH significant for sleep apnea and HLD.  Past Medical History:  Diagnosis Date   Hyperlipidemia    Shingles    Sleep apnea 2017   on CPAP   Wears glasses     Patient Active Problem List   Diagnosis Date Noted   COVID-19 virus infection 11/19/2020   Wrist tendonitis 04/29/2017   OSA (obstructive sleep apnea) 06/28/2015   Vesicles 02/17/2015   Hyperlipidemia 01/14/2012   Skin lesion 08/13/2011    Past Surgical History:  Procedure Laterality Date   WISDOM TOOTH EXTRACTION         Home Medications    Prior to Admission medications   Medication Sig Start Date End Date Taking? Authorizing Provider  moxifloxacin (VIGAMOX) 0.5 % ophthalmic solution Place 1 drop into the left eye 3 (three) times daily for 5 days. 06/13/21 06/18/21 Yes Eliezer Lofts, FNP  AMBULATORY NON FORMULARY MEDICATION Supply ordered: CPAP and other supplies needed (headgear to fit, cushions, filters, heated tubing and water chamber) Dx: obstructive sleep apnea Settings: auto-titration 5-20 cmH2O 09/13/20   [name], DO  diclofenac sodium (VOLTAREN) 1 % GEL Apply 2 g topically 4 (four) times daily. To affected joint. 04/29/17   Gregor Hams, MD  Multiple Vitamin (MULTIVITAMIN) capsule Take 1 capsule by mouth daily.    [provider]    Family History Family History  Problem Relation Age of Onset   Diabetes Father    Hyperlipidemia Father    Prostate cancer Maternal Grandfather    Breast cancer Paternal Grandmother    Colon cancer Neg Hx    Esophageal cancer Neg Hx    Rectal cancer Neg Hx    Stomach cancer Neg Hx     Social  History Social History   Tobacco Use   Smoking status: Never   Smokeless tobacco: Never  Vaping Use   Vaping Use: Never used  Substance Use Topics   Alcohol use: Yes    Comment: rarely   Drug use: No     Allergies   Patient has no known allergies.   Review of Systems Review of Systems  Eyes:  Positive for redness.  All other systems reviewed and are negative.   Physical Exam Triage Vital Signs ED Triage Vitals  Enc Vitals Group     BP 06/13/21 1625 (!) 134/92     Pulse Rate 06/13/21 1625 86     Resp 06/13/21 1625 16     Temp 06/13/21 1625 98 F (36.7 C)     Temp Source 06/13/21 1625 Oral     SpO2 06/13/21 1625 98 %     Weight 06/13/21 1628 230 lb (104.3 kg)     Height --      Head Circumference --      Peak Flow --      Pain Score 06/13/21 1627 1     Pain Loc --      Pain Edu? --      Excl. in [place]? --    No data found.  Updated Vital Signs BP (!) 134/92 (BP Location: Right Arm)  Pulse 86    Temp 98 F (36.7 C) (Oral)    Resp 16    Wt 230 lb (104.3 kg)    SpO2 98%    BMI 31.19 kg/m   Visual Acuity Right Eye Distance: 20/25 Left Eye Distance: 20/25 Bilateral Distance: 20/25 Physical Exam Vitals and nursing note reviewed.  Constitutional:      General: He is not in acute distress.    Appearance: Normal appearance. He is normal weight.  HENT:     Head: Normocephalic and atraumatic.     Mouth/Throat:     Mouth: Mucous membranes are moist.     Pharynx: Oropharynx is clear.  Eyes:     General:        Right eye: No discharge.        Left eye: No discharge.     Extraocular Movements: Extraocular movements intact.     Conjunctiva/sclera: Conjunctivae normal.     Pupils: Pupils are equal, round, and reactive to light.     Comments: Left eye: Sclera with +2 injection, no discharge or drainage noted  Cardiovascular:     Rate and Rhythm: Normal rate and regular rhythm.     Pulses: Normal pulses.     Heart sounds: Normal heart sounds.  Pulmonary:      Effort: Pulmonary effort is normal.     Breath sounds: Normal breath sounds. No wheezing, rhonchi or rales.  Musculoskeletal:     Cervical back: Normal range of motion and neck supple.  Skin:    General: Skin is warm and dry.  Neurological:     General: No focal deficit present.     Mental Status: He is alert and oriented to person, place, and time.     UC Treatments / Results  Labs (all labs ordered are listed, but only abnormal results are displayed) Labs Reviewed - No data to display  EKG   Radiology No results found.  Procedures Procedures (including critical care time)  Medications Ordered in UC Medications - No data to display  Initial Impression / Assessment and Plan / UC Course  I have reviewed the triage vital signs and the nursing notes.  Pertinent labs & imaging results that were available during my care of the patient were reviewed by me and considered in my medical decision making (see chart for details).     MDM: 1.  Conjunctivitis of left eye, unspecified conjunctivitis type-Rx'd Vigamox. Advised patient to instill eyedrops into left eye as directed.  Advised patient if experiencing same symptoms in right eye may treat right eye as well.  Urged home, hemodynamically stable. Final Clinical Impressions(s) / UC Diagnoses   Final diagnoses:  Conjunctivitis of left eye, unspecified conjunctivitis type     Discharge Instructions      Advised patient to instill eyedrops into left eye as directed.  Advised patient if experiencing same symptoms in right eye may treat as well.     ED Prescriptions     Medication Sig Dispense Auth. Provider   moxifloxacin (VIGAMOX) 0.5 % ophthalmic solution Place 1 drop into the left eye 3 (three) times daily for 5 days. 3 mL Eliezer Lofts, FNP      PDMP not reviewed this encounter.   Eliezer Lofts, [place] 06/13/21 1656

## 2021-06-30 ENCOUNTER — Ambulatory Visit: Payer: BC Managed Care – PPO | Admitting: Sports Medicine

## 2021-06-30 ENCOUNTER — Ambulatory Visit (INDEPENDENT_AMBULATORY_CARE_PROVIDER_SITE_OTHER): Payer: BC Managed Care – PPO

## 2021-06-30 ENCOUNTER — Other Ambulatory Visit: Payer: Self-pay

## 2021-06-30 DIAGNOSIS — G8929 Other chronic pain: Secondary | ICD-10-CM

## 2021-06-30 DIAGNOSIS — M25572 Pain in left ankle and joints of left foot: Secondary | ICD-10-CM

## 2021-06-30 MED ORDER — MELOXICAM 15 MG PO TABS: ORAL_TABLET | ORAL | 3 refills | Status: DC

## 2021-06-30 NOTE — Progress Notes (Signed)
° ° °  Procedures performed today:    None.  Independent interpretation of notes and tests performed by another provider:   None.  Brief History, Exam, Impression, and Recommendations:    Chronic pain of left ankle Micheal Hines is a pleasant 46 year old male, he had months of pain left ankle behind the lateral malleolus, distal to the lateral malleolus. No trauma, occasional popping. On exam he does have some tenderness posterior and distal to the lateral malleolus over the peroneals, reproduction of pain with resisted eversion of the ankle. Only minimal swelling. Adding a lateral wedge, x-rays, meloxicam, peroneal tendinitis conditioning, return to see me in 4-6 weeks, peroneal sheath injection +/- MRI if not better.  Chronic process with exacerbation and pharmacologic intervention  ___________________________________________ [name]. [name], M.D., ABFM., CAQSM. Primary Care and [place] Instructor of [place] [employer] [facility] [employer]

## 2021-06-30 NOTE — Assessment & Plan Note (Signed)
Micheal Hines is a pleasant 46 year old male, he had months of pain left ankle behind the lateral malleolus, distal to the lateral malleolus. No trauma, occasional popping. On exam he does have some tenderness posterior and distal to the lateral malleolus over the peroneals, reproduction of pain with resisted eversion of the ankle. Only minimal swelling. Adding a lateral wedge, x-rays, meloxicam, peroneal tendinitis conditioning, return to see me in 4-6 weeks, peroneal sheath injection +/- MRI if not better.

## 2021-07-28 ENCOUNTER — Ambulatory Visit: Payer: BC Managed Care – PPO | Admitting: Sports Medicine

## 2021-07-28 ENCOUNTER — Other Ambulatory Visit: Payer: Self-pay

## 2021-07-28 DIAGNOSIS — M25572 Pain in left ankle and joints of left foot: Secondary | ICD-10-CM | POA: Diagnosis not present

## 2021-07-28 DIAGNOSIS — G8929 Other chronic pain: Secondary | ICD-10-CM | POA: Diagnosis not present

## 2021-07-28 NOTE — Progress Notes (Signed)
? ? ?  Procedures performed today:   ? ?None. ? ?Independent interpretation of notes and tests performed by another provider:  ? ?None. ? ?Brief History, Exam, Impression, and Recommendations:   ? ?Chronic pain of left ankle ?Micheal Hines is a very pleasant 46 year old male, we saw him about a month ago with months of pain left ankle behind the lateral malleolus and distal to the lateral malleolus, no history of trauma, he did have some occasional popping. ?On exam he had tenderness posterior and distal to the lateral malleolus over the peroneal tendons with reproduction of pain with resisted eversion of the ankle, we treated him conservatively with lateral wedges, meloxicam, peroneal tendinitis conditioning exercises and he returns today almost completely better. ?I did give him a couple of additional lateral posting wedges, he will continue conditioning and return to see me as needed, I also recommended that he wear an ASO when out and about being highly physically active to prevent recurrent injury. ? ? ? ?___________________________________________ ?[name]. [name], M.D., ABFM., CAQSM. ?Primary Care and Sports Medicine ?[place] ? ?Adjunct Instructor of Family Medicine  ?[employer] ?

## 2021-07-28 NOTE — Assessment & Plan Note (Signed)
Merry Proud is a very pleasant 46 year old male, we saw him about a month ago with months of pain left ankle behind the lateral malleolus and distal to the lateral malleolus, no history of trauma, he did have some occasional popping. ?On exam he had tenderness posterior and distal to the lateral malleolus over the peroneal tendons with reproduction of pain with resisted eversion of the ankle, we treated him conservatively with lateral wedges, meloxicam, peroneal tendinitis conditioning exercises and he returns today almost completely better. ?I did give him a couple of additional lateral posting wedges, he will continue conditioning and return to see me as needed, I also recommended that he wear an ASO when out and about being highly physically active to prevent recurrent injury. ?

## 2021-09-08 ENCOUNTER — Encounter: Payer: BC Managed Care – PPO | Admitting: Family Medicine

## 2021-09-08 ENCOUNTER — Encounter: Payer: BC Managed Care – PPO | Admitting: Osteopathic Medicine

## 2021-10-02 ENCOUNTER — Ambulatory Visit (INDEPENDENT_AMBULATORY_CARE_PROVIDER_SITE_OTHER): Payer: BC Managed Care – PPO | Admitting: Family Medicine

## 2021-10-02 ENCOUNTER — Encounter: Payer: Self-pay | Admitting: Family Medicine

## 2021-10-02 VITALS — BP 114/71 | HR 67 | Ht 71.75 in | Wt 233.0 lb

## 2021-10-02 DIAGNOSIS — Z1159 Encounter for screening for other viral diseases: Secondary | ICD-10-CM

## 2021-10-02 DIAGNOSIS — Z114 Encounter for screening for human immunodeficiency virus [HIV]: Secondary | ICD-10-CM | POA: Diagnosis not present

## 2021-10-02 DIAGNOSIS — Z0000 Encounter for general adult medical examination without abnormal findings: Secondary | ICD-10-CM

## 2021-10-02 LAB — CBC
Hemoglobin: 16.9 g/dL (ref 13.2–17.1)
Platelets: 227 10*3/uL (ref 140–400)

## 2021-10-02 NOTE — Progress Notes (Addendum)
Complete physical exam  Patient: Micheal Hines   DOB: 1975-06-24   46 y.o. Male  MRN: [id number]  Subjective:    Chief Complaint  Patient presents with   Annual Exam    Micheal Hines is a 46 y.o. male who presents today for a complete physical exam. He reports consuming a general diet.  Workingin the yard/garden  He generally feels well. Marland Kitchen He does have additional problems to discuss today.   He would like to be tested for diabetes on his labs.  Dad with diabetes and a sister with prediabetes.  He just had his eye exam completed at eye care center about 2 days ago.  Also wanted me to check a few moles on his skin today to make sure that they are not worrisome.  Most recent fall risk assessment:    10/02/2021    9:11 AM  Fall Risk   Falls in the past year? 0  Number falls in past yr: 0  Injury with Fall? 0  Risk for fall due to : No Fall Risks  Follow up Falls prevention discussed     Most recent depression screenings:    10/02/2021    9:12 AM 03/07/2018    3:29 PM  PHQ 2/9 Scores  PHQ - 2 Score 0 0  PHQ- 9 Score  1      Past Surgical History:  Procedure Laterality Date   WISDOM TOOTH EXTRACTION     Social History   Tobacco Use   Smoking status: Never   Smokeless tobacco: Never  Vaping Use   Vaping Use: Never used  Substance Use Topics   Alcohol use: Yes    Comment: rarely   Drug use: No   Family History  Problem Relation Age of Onset   Diabetes Father    Hyperlipidemia Father    Prostate cancer Maternal Grandfather    Breast cancer Paternal Grandmother    Colon cancer Neg Hx    Esophageal cancer Neg Hx    Rectal cancer Neg Hx    Stomach cancer Neg Hx       Patient Care Team: System, Provider Not In as PCP - General   Outpatient Medications Prior to Visit  Medication Sig   AMBULATORY NON FORMULARY MEDICATION Supply ordered: CPAP and other supplies needed (headgear to fit, cushions, filters, heated tubing and water chamber) Dx: obstructive  sleep apnea Settings: auto-titration 5-20 cmH2O   diclofenac sodium (VOLTAREN) 1 % GEL Apply 2 g topically 4 (four) times daily. To affected joint.   meloxicam (MOBIC) 15 MG tablet One tab PO qAM with a meal for 2 weeks, then daily prn pain.   Multiple Vitamin (MULTIVITAMIN) capsule Take 1 capsule by mouth daily.   No facility-administered medications prior to visit.    ROS        Objective:     BP 114/71   Pulse 67   Ht 5' 11.75" (1.822 m)   Wt 233 lb (105.7 kg)   SpO2 100%   BMI 31.82 kg/m    Physical Exam Constitutional:      Appearance: He is well-developed.  HENT:     Head: Normocephalic and atraumatic.     Right Ear: Tympanic membrane, ear canal and external ear normal.     Left Ear: Tympanic membrane, ear canal and external ear normal.     Nose: Nose normal.  Eyes:     Conjunctiva/sclera: Conjunctivae normal.     Pupils: Pupils are equal, round, and reactive  to light.  Neck:     Thyroid: No thyromegaly.     Vascular: No carotid bruit.  Cardiovascular:     Rate and Rhythm: Normal rate and regular rhythm.     Heart sounds: Normal heart sounds.  Pulmonary:     Effort: Pulmonary effort is normal.     Breath sounds: Normal breath sounds.  Abdominal:     General: Bowel sounds are normal. There is no distension.     Palpations: Abdomen is soft. There is no mass.     Tenderness: There is no abdominal tenderness. There is no guarding or rebound.  Musculoskeletal:        General: Normal range of motion.     Cervical back: Normal range of motion and neck supple.  Lymphadenopathy:     Cervical: No cervical adenopathy.  Skin:    General: Skin is warm and dry.  Neurological:     Mental Status: He is alert and oriented to person, place, and time.     Deep Tendon Reflexes: Reflexes are normal and symmetric.  Psychiatric:        Behavior: Behavior normal.        Thought Content: Thought content normal.        Judgment: Judgment normal.     No results found for  any visits on 10/02/21.     Assessment & Plan:    Routine Health Maintenance and Physical Exam  Immunization History  Administered Date(s) Administered   Influenza Whole 01/07/2011   Influenza,inj,Quad PF,6+ Mos 01/19/2013, 04/29/2017, 01/04/2018, 02/24/2021   Janssen (J&J) SARS-COV-2 Vaccination 07/14/2019   Tdap 12/04/2010, 09/04/2020    Health Maintenance  Topic Date Due   Hepatitis C Screening  Never done   COVID-19 Vaccine (2 - Booster for Janssen series) 10/19/2023 (Originally 09/08/2019)   INFLUENZA VACCINE  12/02/2021   COLONOSCOPY (Pts 45-39yr Insurance coverage will need to be confirmed)  06/04/2023   TETANUS/TDAP  09/05/2030   HIV Screening  Completed   HPV VACCINES  Aged Out    Discussed health benefits of physical activity, and encouraged him to engage in regular exercise appropriate for his age and condition.  Problem List Items Addressed This Visit   None Visit Diagnoses     Wellness examination    -  Primary   Relevant Orders   Lipid Panel w/reflex Direct LDL   COMPLETE METABOLIC PANEL WITH GFR   CBC   Hepatitis C Antibody   HIV Antibody (routine testing w rflx)   Hemoglobin A1c   Encounter for hepatitis C screening test for low risk patient       Relevant Orders   Hepatitis C Antibody   Screening for HIV without presence of risk factors       Relevant Orders   HIV Antibody (routine testing w rflx)       Keep up a regular exercise program and make sure you are eating a healthy diet Try to eat 4 servings of dairy a day, or if you are lactose intolerant take a calcium with vitamin D daily.  Your vaccines are up to date.   Return in about 1 year (around 10/03/2022) for Wellness Exam.     CBeatrice Lecher MD

## 2021-10-03 LAB — COMPLETE METABOLIC PANEL WITH GFR
AG Ratio: 1.8 (calc) (ref 1.0–2.5)
ALT: 26 U/L (ref 9–46)
AST: 20 U/L (ref 10–40)
Albumin: 4.7 g/dL (ref 3.6–5.1)
Alkaline phosphatase (APISO): 56 U/L (ref 36–130)
BUN: 13 mg/dL (ref 7–25)
CO2: 24 mmol/L (ref 20–32)
Calcium: 9.4 mg/dL (ref 8.6–10.3)
Chloride: 108 mmol/L (ref 98–110)
Creat: 1.07 mg/dL (ref 0.60–1.29)
Globulin: 2.6 g/dL (calc) (ref 1.9–3.7)
Glucose, Bld: 86 mg/dL (ref 65–99)
Potassium: 4.3 mmol/L (ref 3.5–5.3)
Sodium: 142 mmol/L (ref 135–146)
Total Bilirubin: 1 mg/dL (ref 0.2–1.2)
Total Protein: 7.3 g/dL (ref 6.1–8.1)
eGFR: 88 mL/min/{1.73_m2} (ref >=60)

## 2021-10-03 LAB — CBC
HCT: 49.8 % (ref 38.5–50.0)
MCH: 30.6 pg (ref 27.0–33.0)
MCHC: 33.9 g/dL (ref 32.0–36.0)
MCV: 90.2 fL (ref 80.0–100.0)
MPV: 10.9 fL (ref 7.5–12.5)
RBC: 5.52 10*6/uL (ref 4.20–5.80)
RDW: 12.3 % (ref 11.0–15.0)
WBC: 5.3 10*3/uL (ref 3.8–10.8)

## 2021-10-03 LAB — HEMOGLOBIN A1C
Hgb A1c MFr Bld: 5.1 % of total Hgb (ref <5.7)
Mean Plasma Glucose: 100 mg/dL
eAG (mmol/L): 5.5 mmol/L

## 2021-10-03 LAB — HEPATITIS C ANTIBODY
Hepatitis C Ab: NONREACTIVE
SIGNAL TO CUT-OFF: 0.1 (ref <1.00)

## 2021-10-03 LAB — LIPID PANEL W/REFLEX DIRECT LDL
Cholesterol: 231 mg/dL — ABNORMAL HIGH (ref <200)
HDL: 39 mg/dL — ABNORMAL LOW (ref >=40)
LDL Cholesterol (Calc): 163 mg/dL (calc) — ABNORMAL HIGH
Non-HDL Cholesterol (Calc): 192 mg/dL (calc) — ABNORMAL HIGH (ref <130)
Total CHOL/HDL Ratio: 5.9 (calc) — ABNORMAL HIGH (ref <5.0)
Triglycerides: 149 mg/dL (ref <150)

## 2021-10-03 NOTE — Progress Notes (Signed)
Hi Micheal Hines,  Your LDL jumped up significantly this year.  Please continue to work on healthy diet and regular exercise. Normal metabolic panel and blood count. Negative for Hep C. No Diabetes!  The 10-year ASCVD risk score (Arnett DK, et al., 2019) is: 2.4%   Values used to calculate the score:     Age: 46 years     Sex: Male     Is Non-Hispanic African American: No     Diabetic: No     Tobacco smoker: No     Systolic Blood Pressure: 939 mmHg     Is BP treated: No     HDL Cholesterol: 39 mg/dL     Total Cholesterol: 231 mg/dL

## 2021-10-23 ENCOUNTER — Other Ambulatory Visit: Payer: Self-pay | Admitting: Sports Medicine

## 2021-10-23 DIAGNOSIS — G8929 Other chronic pain: Secondary | ICD-10-CM

## 2022-05-05 ENCOUNTER — Encounter: Payer: Self-pay | Admitting: Family Medicine

## 2022-05-05 ENCOUNTER — Ambulatory Visit: Payer: BC Managed Care – PPO | Admitting: Family Medicine

## 2022-05-05 VITALS — BP 124/75 | HR 75 | Ht 71.75 in | Wt 233.0 lb

## 2022-05-05 DIAGNOSIS — M545 Low back pain, unspecified: Secondary | ICD-10-CM

## 2022-05-05 DIAGNOSIS — Z23 Encounter for immunization: Secondary | ICD-10-CM

## 2022-05-05 IMAGING — DX DG ANKLE COMPLETE 3+V*L*
3 series · 3 of 3 positions shown · non-contrast
Comparison: None.

CLINICAL DATA: Left lateral ankle pain x 1-2 weeks.

EXAM:
LEFT ANKLE COMPLETE - 3+ VIEW

[ankle ap]
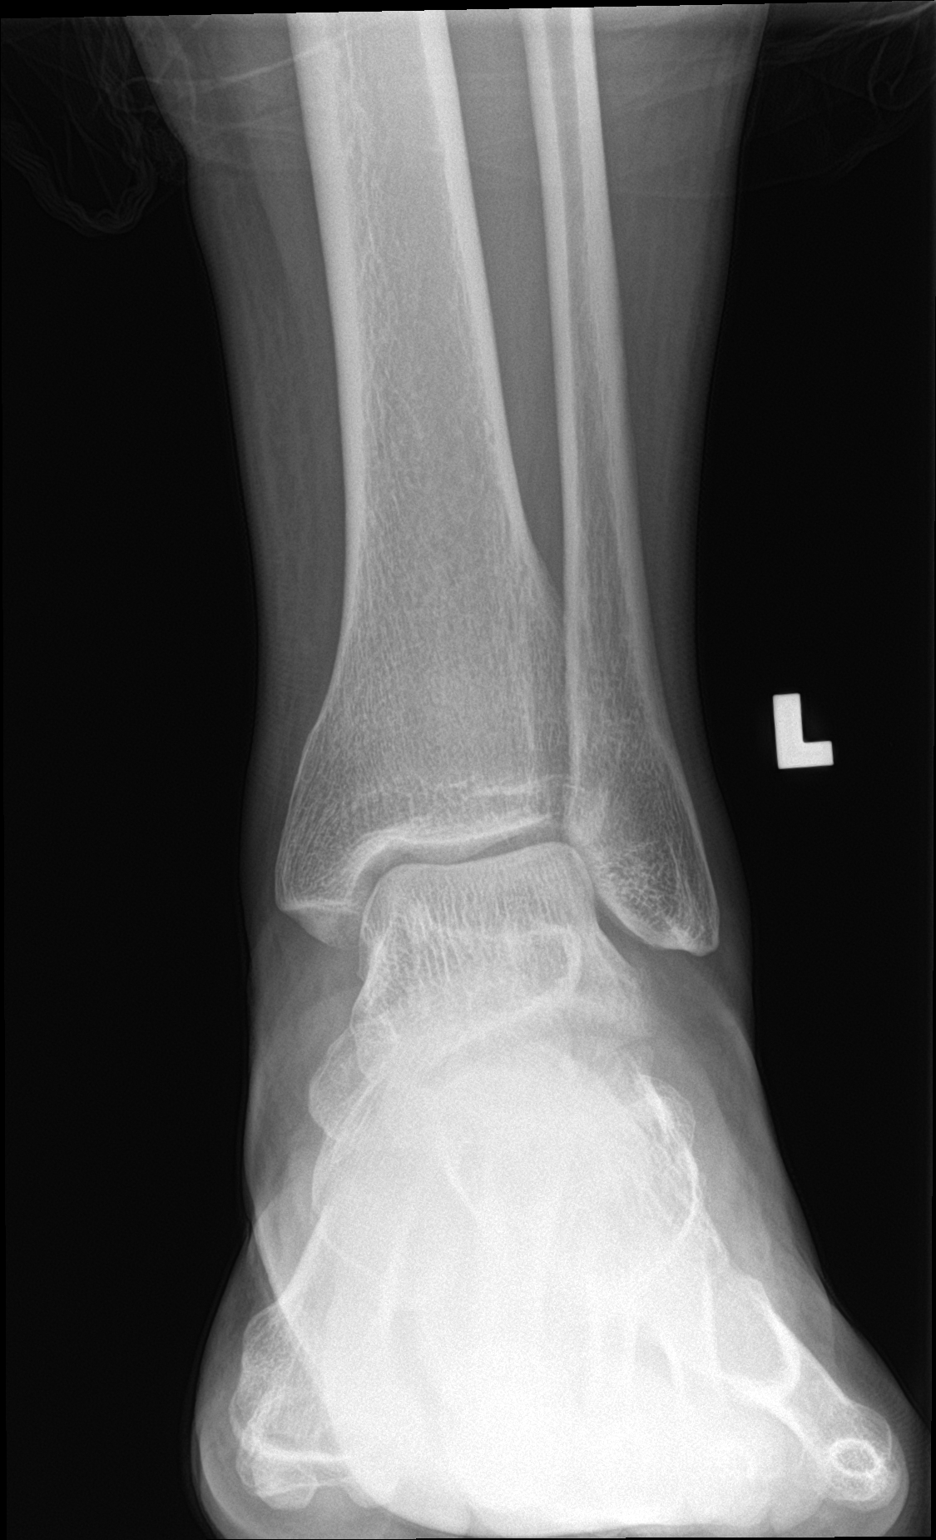

[ankle obl]
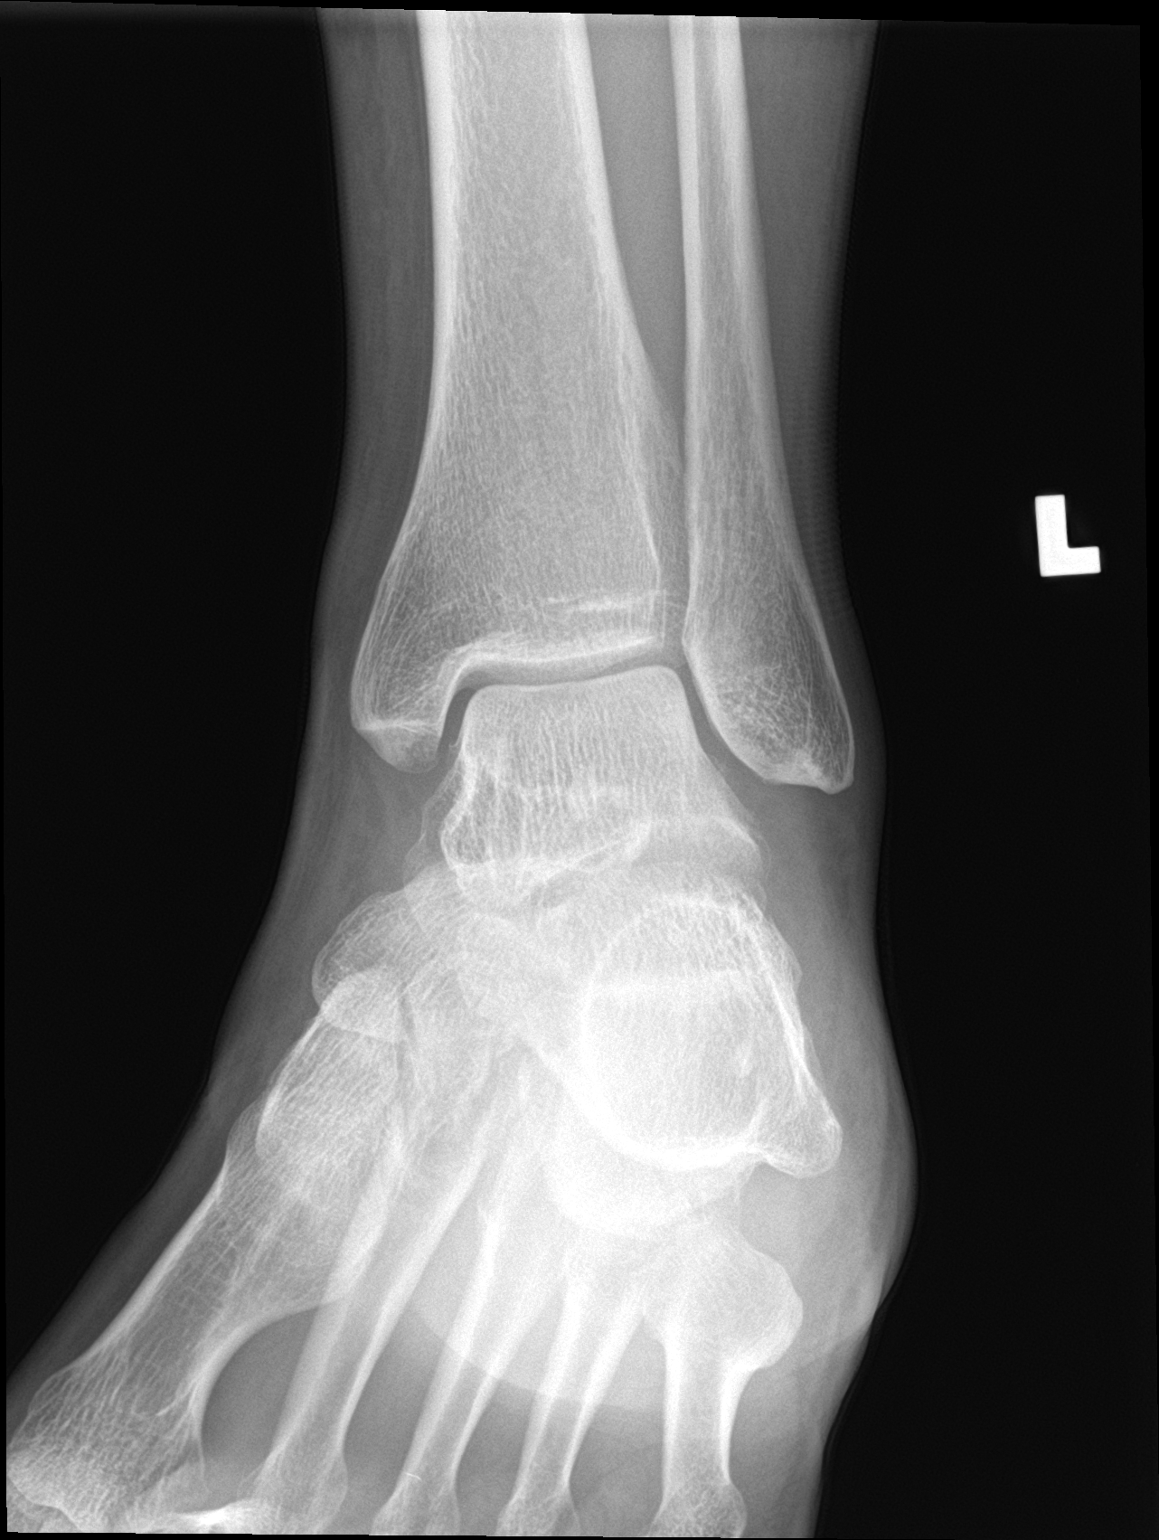

[ankle lat]
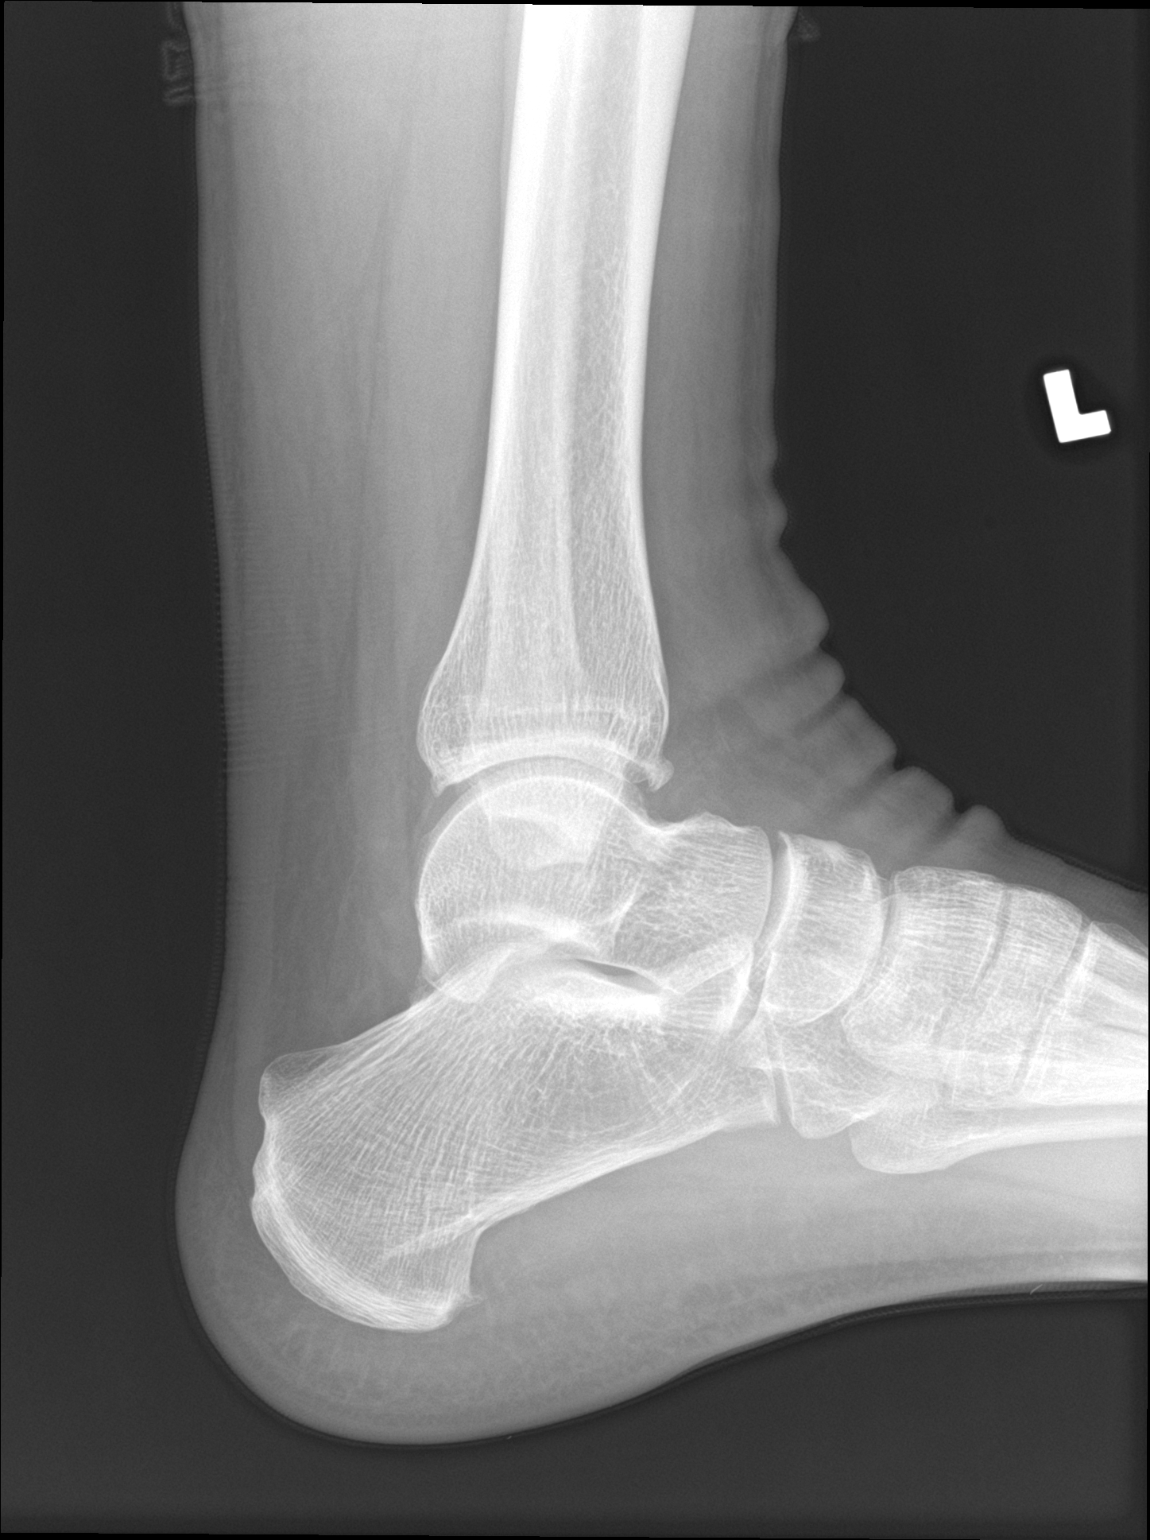

[3 of 3 positions shown; findings below may reference images not displayed]

FINDINGS: There is no evidence of fracture, dislocation, or joint effusion.
There is no evidence of arthropathy or other focal bone abnormality.
Soft tissues are unremarkable.
IMPRESSION: Negative.

## 2022-05-05 NOTE — Progress Notes (Signed)
   Acute Office Visit  Subjective:     Patient ID: Micheal Hines, male    DOB: [birth year], 47 y.o.   MRN: [id number]  Chief Complaint  Patient presents with   Back Pain    HPI Patient is in today for bilateral low back pain. No injury or trauma.  Hard to straighten up.  Sits all day for work.  Using Tylenol and motrin. Worse in AM.  At times he has pain that will shoot down into the right leg.  He says it last happened at the end of November when he was at [employer] and he just stepped off the curb awkwardly.  He had to wait for several minutes before he could get enough relief to be able to stand and walk.  ROS      Objective:    BP 124/75   Pulse 75   Ht 5' 11.75" (1.822 m)   Wt 233 lb (105.7 kg)   SpO2 95%   BMI 31.82 kg/m     Physical Exam Vitals reviewed.  Constitutional:      Appearance: He is well-developed.  HENT:     Head: Normocephalic and atraumatic.  Eyes:     Conjunctiva/sclera: Conjunctivae normal.  Cardiovascular:     Rate and Rhythm: Normal rate.  Pulmonary:     Effort: Pulmonary effort is normal.  Musculoskeletal:     Comments: Normal lumbar flexion and extension.  Normal rotation right and left.  Some discomfort with sidebending right and left.  But symmetric.  Negative straight leg raise bilaterally.  Hip, knee and ankle strength is 5 out of 5  Skin:    General: Skin is dry.     Coloration: Skin is not pale.  Neurological:     Mental Status: He is alert and oriented to person, place, and time.  Psychiatric:        Behavior: Behavior normal.     No results found for any visits on 05/05/22.      Assessment & Plan:   Problem List Items Addressed This Visit   None Visit Diagnoses     Acute bilateral low back pain without sciatica    -  Primary   Relevant Orders   DG Lumbar Spine Complete   Need for immunization against influenza       Relevant Orders   Flu Vaccine QUAD 38moIM (Fluarix, Fluzone & Alfiuria Quad PF) (Completed)        Acute bilateral low back pain with intermittent sciatica-we discussed most consistent with a degenerative disc issue.  Will get a plain film x-ray since this has been going on for about a year at this point.  We discussed working on getting a sOffice managerthrough work.  Also recommended formal physical therapy depending on the x-ray results to work on improving back pain and core strengthening.   No orders of the defined types were placed in this encounter.   No follow-ups on file.  CBeatrice Lecher MD

## 2022-05-14 ENCOUNTER — Ambulatory Visit (INDEPENDENT_AMBULATORY_CARE_PROVIDER_SITE_OTHER): Payer: BC Managed Care – PPO

## 2022-05-14 DIAGNOSIS — M545 Low back pain, unspecified: Secondary | ICD-10-CM | POA: Diagnosis not present

## 2022-05-14 DIAGNOSIS — G8929 Other chronic pain: Secondary | ICD-10-CM | POA: Diagnosis not present

## 2022-05-18 ENCOUNTER — Encounter: Payer: Self-pay | Admitting: Family Medicine

## 2022-05-18 DIAGNOSIS — M545 Low back pain, unspecified: Secondary | ICD-10-CM

## 2022-05-18 NOTE — Progress Notes (Signed)
Hi Jeff, x-ray of your spine does show some degenerative arthritis you also have evidence of some disc space loss or herniated disc at L5-S1 as well as L3-4 and L4-5.  Have some bone spurs as well in the spine./There is also a little bit of narrowing in the canal at L5-S1 but it is not pressing on the spinal cord.  You would benefit from formal physical therapy at this point and then if you are not improving we could consider further consultation with sports med or orthopedist.  If you are okay with doing formal physical therapy please let me know and I am happy to place referral.

## 2022-05-21 ENCOUNTER — Ambulatory Visit: Payer: BC Managed Care – PPO | Admitting: Physical Therapy

## 2022-05-21 NOTE — Therapy (Deleted)
OUTPATIENT PHYSICAL THERAPY THORACOLUMBAR EVALUATION   Patient Name: Micheal Hines MRN: [id number] DOB:27-Sep-1975, 47 y.o., male Today's Date: 05/21/2022  END OF SESSION:   Past Medical History:  Diagnosis Date   COVID-19 virus infection 11/19/2020   Hyperlipidemia    Shingles    Sleep apnea 2017   on CPAP   Wears glasses    Past Surgical History:  Procedure Laterality Date   WISDOM TOOTH EXTRACTION     Patient Active Problem List   Diagnosis Date Noted   Chronic pain of left ankle 06/30/2021   Wrist tendonitis 04/29/2017   OSA (obstructive sleep apnea) 06/28/2015   Vesicles 02/17/2015   Hyperlipidemia 01/14/2012   Skin lesion 08/13/2011    PCP: Beatrice Lecher MD  REFERRING PROVIDER: Hali Marry, MD  REFERRING DIAG: M54.50 (ICD-10-CM) - Acute bilateral low back pain without sciatica  Rationale for Evaluation and Treatment: Rehabilitation  THERAPY DIAG:  No diagnosis found.  ONSET DATE: ***  SUBJECTIVE:                                                                                                                                                                                           SUBJECTIVE STATEMENT: ***  PERTINENT HISTORY:  ***  PAIN:  Are you having pain? {OPRCPAIN:27236}  PRECAUTIONS: {Therapy precautions:24002}  WEIGHT BEARING RESTRICTIONS: {Yes ***/No:24003}  FALLS:  Has patient fallen in last 6 months? {fallsyesno:27318}  LIVING ENVIRONMENT: Lives with: {[facility] lives with:25569::"lives with their family"} Lives in: {Lives in:25570} Stairs: {opstairs:27293} Has following equipment at home: {Assistive devices:23999}  OCCUPATION: ***  PLOF: {PLOF:24004}  PATIENT GOALS: ***  NEXT MD VISIT:   OBJECTIVE:   DIAGNOSTIC FINDINGS:  Lumbar x-ray 05/14/22 IMPRESSION: 1. Degenerative changes without evidence of fractures. 2. A rim calcified posterior disc protrusion at L5-S1 is increasingly calcified since the previous  exam. 3. Slight dextrorotary lumbar scoliosis apex at L3. 4. L5-S1 foraminal stenosis.  PATIENT SURVEYS:  FOTO ***  SCREENING FOR RED FLAGS: Bowel or bladder incontinence: {Yes/No:304960894} Spinal tumors: {Yes/No:304960894} Cauda equina syndrome: {Yes/No:304960894} Compression fracture: {Yes/No:304960894} Abdominal aneurysm: {Yes/No:304960894}  COGNITION: Overall cognitive status: {cognition:24006}     SENSATION: {sensation:27233}  MUSCLE LENGTH: Hamstrings: Right *** deg; Left *** deg Thomas test: Right *** deg; Left *** deg  POSTURE: {posture:25561}  PALPATION: ***  LUMBAR ROM:   AROM eval  Flexion   Extension   Right lateral flexion   Left lateral flexion   Right rotation   Left rotation    (Blank rows = not tested)  LOWER EXTREMITY ROM:     {AROM/PROM:27142}  Right eval Left eval  Hip flexion    Hip  extension    Hip abduction    Hip adduction    Hip internal rotation    Hip external rotation    Knee flexion    Knee extension    Ankle dorsiflexion    Ankle plantarflexion    Ankle inversion    Ankle eversion     (Blank rows = not tested)  LOWER EXTREMITY MMT:    MMT Right eval Left eval  Hip flexion    Hip extension    Hip abduction    Hip adduction    Hip internal rotation    Hip external rotation    Knee flexion    Knee extension    Ankle dorsiflexion    Ankle plantarflexion    Ankle inversion    Ankle eversion     (Blank rows = not tested)  LUMBAR SPECIAL TESTS:  {lumbar special test:25242}  FUNCTIONAL TESTS:  {Functional tests:24029}  GAIT: Distance walked: *** Assistive device utilized: {Assistive devices:23999} Level of assistance: {Levels of assistance:24026} Comments: ***  TODAY'S TREATMENT:                                                                                                                              DATE: 05/21/22 ***    PATIENT EDUCATION:  Education details: *** Person educated: {Person  educated:25204} Education method: {Education Method:25205} Education comprehension: {Education Comprehension:25206}  HOME EXERCISE PROGRAM: ***  ASSESSMENT:  CLINICAL IMPRESSION: Patient is a *** y.o. *** who was seen today for physical therapy evaluation and treatment for ***.   OBJECTIVE IMPAIRMENTS: {opptimpairments:25111}.   ACTIVITY LIMITATIONS: {activitylimitations:27494}  PARTICIPATION LIMITATIONS: {participationrestrictions:25113}  PERSONAL FACTORS: {Personal factors:25162} are also affecting patient's functional outcome.   REHAB POTENTIAL: {rehabpotential:25112}  CLINICAL DECISION MAKING: {clinical decision making:25114}  EVALUATION COMPLEXITY: {Evaluation complexity:25115}   GOALS: Goals reviewed with patient? {yes/no:20286}  SHORT TERM GOALS: Target date: ***  *** Baseline: Goal status: {GOALSTATUS:25110}  2.  *** Baseline:  Goal status: {GOALSTATUS:25110}  3.  *** Baseline:  Goal status: {GOALSTATUS:25110}  4.  *** Baseline:  Goal status: {GOALSTATUS:25110}  5.  *** Baseline:  Goal status: {GOALSTATUS:25110}  6.  *** Baseline:  Goal status: {GOALSTATUS:25110}  LONG TERM GOALS: Target date: ***  *** Baseline:  Goal status: {GOALSTATUS:25110}  2.  *** Baseline:  Goal status: {GOALSTATUS:25110}  3.  *** Baseline:  Goal status: {GOALSTATUS:25110}  4.  *** Baseline:  Goal status: {GOALSTATUS:25110}  5.  *** Baseline:  Goal status: {GOALSTATUS:25110}  6.  *** Baseline:  Goal status: {GOALSTATUS:25110}  PLAN:  PT FREQUENCY: {rehab frequency:25116}  PT DURATION: {rehab duration:25117}  PLANNED INTERVENTIONS: {rehab planned interventions:25118::"Therapeutic exercises","Therapeutic activity","Neuromuscular re-education","Balance training","Gait training","Patient/Family education","Self Care","Joint mobilization"}.  PLAN FOR NEXT SESSION: ***   Ansh Fauble April Ma L Kiva Norland, PT 05/21/2022, 8:27 AM

## 2022-05-26 ENCOUNTER — Ambulatory Visit: Payer: BC Managed Care – PPO | Admitting: Rehabilitative and Restorative Service Providers"

## 2022-06-03 ENCOUNTER — Other Ambulatory Visit: Payer: Self-pay

## 2022-06-03 ENCOUNTER — Ambulatory Visit: Payer: BC Managed Care – PPO | Attending: Family Medicine | Admitting: Physical Therapy

## 2022-06-03 ENCOUNTER — Encounter: Payer: Self-pay | Admitting: Physical Therapy

## 2022-06-03 DIAGNOSIS — R29898 Other symptoms and signs involving the musculoskeletal system: Secondary | ICD-10-CM

## 2022-06-03 DIAGNOSIS — M545 Low back pain, unspecified: Secondary | ICD-10-CM | POA: Insufficient documentation

## 2022-06-03 DIAGNOSIS — M5459 Other low back pain: Secondary | ICD-10-CM

## 2022-06-03 NOTE — Therapy (Unsigned)
OUTPATIENT PHYSICAL THERAPY THORACOLUMBAR EVALUATION   Patient Name: Micheal Hines MRN: [id number] DOB:[birth year], 47 y.o., male Today's Date: 06/03/2022  END OF SESSION:  PT End of Session - 06/03/22 1616     Visit Number 1    Number of Visits 12    Date for PT Re-Evaluation 07/15/22    PT Start Time 4287    PT Stop Time 1610    PT Time Calculation (min) 40 min    Activity Tolerance Patient tolerated treatment well    Behavior During Therapy [facility] for tasks assessed/performed             Past Medical History:  Diagnosis Date   COVID-19 virus infection 11/19/2020   Hyperlipidemia    Shingles    Sleep apnea 2017   on CPAP   Wears glasses    Past Surgical History:  Procedure Laterality Date   WISDOM TOOTH EXTRACTION     Patient Active Problem List   Diagnosis Date Noted   Chronic pain of left ankle 06/30/2021   Wrist tendonitis 04/29/2017   OSA (obstructive sleep apnea) 06/28/2015   Vesicles 02/17/2015   Hyperlipidemia 01/14/2012   Skin lesion 08/13/2011    PCP: Madilyn Fireman  REFERRING PROVIDER: Metheney  REFERRING DIAG: back pain  Rationale for Evaluation and Treatment: Rehabilitation  THERAPY DIAG:  Other symptoms and signs involving the musculoskeletal system  Other low back pain  ONSET DATE: 05/05/22  SUBJECTIVE:                                                                                                                                                                                           SUBJECTIVE STATEMENT: Pt states he has long standing low back pain and it finally got bad enough that he went to the MD. Pain usually is a sharp burst when he has to move quickly or get out of a twisted or bent position and it will linger for a few hours. Pt also has stiffness when he wakes up in the mornings. Pain decreases with stretching and time  PERTINENT HISTORY:  Years ago pt's young son jumped on his low back and pt was unable to move due to pain for a  few hours but eventually the pain decreased and pt was able to return to normal activities  PAIN:  Are you having pain? Yes: NPRS scale: 1/10 currently, 7/10 at worst/10 Pain location: low back Pain description: sharp at worst, "tight" currently Aggravating factors: quick movement, twist, bending Relieving factors: time, stretching  PRECAUTIONS: None  WEIGHT BEARING RESTRICTIONS: No  FALLS:  Has patient fallen in last 6 months? No  OCCUPATION:  IT, desk work  PLOF: Independent  PATIENT GOALS: decrease incidence and intensity of pain  NEXT MD VISIT:   OBJECTIVE:   DIAGNOSTIC FINDINGS:  X ray: 1. Degenerative changes without evidence of fractures. 2. A rim calcified posterior disc protrusion at L5-S1 is increasingly calcified since the previous exam. 3. Slight dextrorotary lumbar scoliosis apex at L3. 4. L5-S1 foraminal stenosis.  PATIENT SURVEYS:  FOTO 67   MUSCLE LENGTH: Hamstrings: Right 90 deg; Left 90 deg  PALPATION: TTP with UPAs L4, L5, S1 bilat Mild increase in mm spasticity lumbar paraspinals  LUMBAR ROM:   AROM eval  Flexion Limited 50%  Extension Limited 25% - pain  Right lateral flexion WFL  Left lateral flexion WFL  Right rotation WFL  Left rotation WFL   (Blank rows = not tested)   LOWER EXTREMITY MMT:    MMT Right eval Left eval  Hip flexion 4+ 4+  Hip extension 4+ 4+  Hip abduction 4+ 4+  Hip adduction    Hip internal rotation    Hip external rotation    Knee flexion    Knee extension    Ankle dorsiflexion    Ankle plantarflexion    Ankle inversion    Ankle eversion     (Blank rows = not tested)  LUMBAR SPECIAL TESTS:  SLR (-) FABER (-)    TODAY'S TREATMENT:                                                                                                                              DATE: 06/03/22 See HEP    PATIENT EDUCATION:  Education details: PT POC and goals, HEP Person educated: Patient Education method:  Explanation, Demonstration, and Handouts Education comprehension: verbalized understanding and returned demonstration  HOME EXERCISE PROGRAM: Access Code: [email or web address] URL: [email or web address] Date: 06/03/2022 Prepared by: Isabelle Course  Exercises - Seated Pelvic Tilt  - 1 x daily - 7 x weekly - 3 sets - 10 reps - Seated Hamstring Stretch  - 1 x daily - 7 x weekly - 1 sets - 3 reps - 20-30 seconds hold - Supine Lower Trunk Rotation  - 1 x daily - 7 x weekly - 1 sets - 10 reps - 10 seconds hold - Supine Single Knee to Chest Stretch  - 1 x daily - 7 x weekly - 1 sets - 3 reps - 20-30 seconds hold  ASSESSMENT:  CLINICAL IMPRESSION: Patient is a 47 y.o. male who was seen today for physical therapy evaluation and treatment for low back pain. Pt presents with decreased flexibility and ROM, decreased functional activity tolerance, impaired posture and increased pain. Pt will benefit from skilled PT to address deficits and improve functional pain free mobility  OBJECTIVE IMPAIRMENTS: decreased strength, hypomobility, increased muscle spasms, impaired flexibility, and pain.   ACTIVITY LIMITATIONS: bending, standing, and locomotion level  PARTICIPATION LIMITATIONS: community activity and occupation  PERSONAL FACTORS: Time since onset of injury/illness/exacerbation are also affecting patient's  functional outcome.   REHAB POTENTIAL: Good  CLINICAL DECISION MAKING: Evolving/moderate complexity  EVALUATION COMPLEXITY: Moderate   GOALS: Goals reviewed with patient? Yes  SHORT TERM GOALS: Target date: 06/17/2022   Pt will be independent in initial HEP Baseline: Goal status: INITIAL   LONG TERM GOALS: Target date: 07/15/2022   Pt will be independent in advanced HEP Baseline:  Goal status: INITIAL  2.  Pt will improve FOTO to >= 74 to demo improved functional mobility Baseline:  Goal status: INITIAL  3.  Pt will perform full lumbar ROM with pain <= 1/10 Baseline:   Goal status: INITIAL  4.  Pt will tolerate standing and/or walking  1 hour with pain <= 1/10 Baseline:  Goal status: INITIAL   PLAN:  PT FREQUENCY: 1x/week  PT DURATION: 6 weeks  PLANNED INTERVENTIONS: Therapeutic exercises, Therapeutic activity, Neuromuscular re-education, Balance training, Gait training, Patient/Family education, Self Care, Joint mobilization, Aquatic Therapy, Dry Needling, Electrical stimulation, Cryotherapy, Moist heat, Taping, Traction, Ultrasound, Ionotophoresis '4mg'$ /ml Dexamethasone, Manual therapy, and Re-evaluation.  PLAN FOR NEXT SESSION: core strength and flexibility   Kashena Novitski, PT 06/03/2022, 4:16 PM

## 2022-06-12 ENCOUNTER — Ambulatory Visit: Payer: BC Managed Care – PPO | Attending: Family Medicine | Admitting: Physical Therapy

## 2022-06-12 ENCOUNTER — Encounter: Payer: Self-pay | Admitting: Physical Therapy

## 2022-06-12 DIAGNOSIS — R29898 Other symptoms and signs involving the musculoskeletal system: Secondary | ICD-10-CM | POA: Insufficient documentation

## 2022-06-12 DIAGNOSIS — M5459 Other low back pain: Secondary | ICD-10-CM | POA: Diagnosis present

## 2022-06-12 NOTE — Therapy (Addendum)
OUTPATIENT PHYSICAL THERAPY TREATMENT AND DISCHARGE   Patient Name: Micheal Hines MRN: [id number] DOB:03-Feb-1976, 47 y.o., male Today's Date: 06/12/2022  END OF SESSION:  PT End of Session - 06/12/22 0930     Visit Number 2    Number of Visits 12    Date for PT Re-Evaluation 07/15/22    PT Start Time 0845    PT Stop Time 0928    PT Time Calculation (min) 43 min    Activity Tolerance Patient tolerated treatment well    Behavior During Therapy [facility] for tasks assessed/performed              Past Medical History:  Diagnosis Date   COVID-19 virus infection 11/19/2020   Hyperlipidemia    Shingles    Sleep apnea 2017   on CPAP   Wears glasses    Past Surgical History:  Procedure Laterality Date   WISDOM TOOTH EXTRACTION     Patient Active Problem List   Diagnosis Date Noted   Chronic pain of left ankle 06/30/2021   Wrist tendonitis 04/29/2017   OSA (obstructive sleep apnea) 06/28/2015   Vesicles 02/17/2015   Hyperlipidemia 01/14/2012   Skin lesion 08/13/2011    PCP: Madilyn Fireman  REFERRING PROVIDER: Metheney  REFERRING DIAG: back pain  Rationale for Evaluation and Treatment: Rehabilitation  THERAPY DIAG:  Other symptoms and signs involving the musculoskeletal system  Other low back pain  ONSET DATE: 05/05/22  SUBJECTIVE:                                                                                                                                                                                           SUBJECTIVE STATEMENT: Pt states he tried the stretching and that it feels good. He states he has been more conscious of his back so he has been careful with his movements  PERTINENT HISTORY:  Years ago pt's young son jumped on his low back and pt was unable to move due to pain for a few hours but eventually the pain decreased and pt was able to return to normal activities  PAIN:  Are you having pain? Yes: NPRS scale: 0/10 currently, 7/10 at worst/10 Pain  location: low back Pain description: sharp at worst, "tight" currently Aggravating factors: quick movement, twist, bending Relieving factors: time, stretching  PRECAUTIONS: None  WEIGHT BEARING RESTRICTIONS: No  FALLS:  Has patient fallen in last 6 months? No  OCCUPATION: IT, desk work  PLOF: Independent  PATIENT GOALS: decrease incidence and intensity of pain  NEXT MD VISIT:   OBJECTIVE:   DIAGNOSTIC FINDINGS:  X ray: 1. Degenerative changes without evidence of fractures.  2. A rim calcified posterior disc protrusion at L5-S1 is increasingly calcified since the previous exam. 3. Slight dextrorotary lumbar scoliosis apex at L3. 4. L5-S1 foraminal stenosis.  PATIENT SURVEYS:  FOTO 67   MUSCLE LENGTH: Hamstrings: Right 90 deg; Left 90 deg  PALPATION: TTP with UPAs L4, L5, S1 bilat Mild increase in mm spasticity lumbar paraspinals  LUMBAR ROM:   AROM eval  Flexion Limited 50%  Extension Limited 25% - pain  Right lateral flexion WFL  Left lateral flexion WFL  Right rotation WFL  Left rotation WFL   (Blank rows = not tested)   LOWER EXTREMITY MMT:    MMT Right eval Left eval  Hip flexion 4+ 4+  Hip extension 4+ 4+  Hip abduction 4+ 4+  Hip adduction    Hip internal rotation    Hip external rotation    Knee flexion    Knee extension    Ankle dorsiflexion    Ankle plantarflexion    Ankle inversion    Ankle eversion     (Blank rows = not tested)    TODAY'S TREATMENT:                                                                                                                              [facility] Adult PT Treatment:                                                DATE: 06/12/22 Therapeutic Exercise: Treadmill 1.7 mph x 5 min warm up Shoulder extension green TB x 20 Row blue TB x 20 Pallof press blue TB x 20 bilat Chops 2 x 10 bilat blue TB Sit <> stand 10#KB 2 x 10 Plank on table 2 x 10 sec LTR x 10 bilat SKTC 5 x 15 sec bilat Seated pelvic tilt  x 20 Quadruped alt LE lift x 10, alt UE/LE lift x 10    DATE: 06/03/22 See HEP    PATIENT EDUCATION:  Education details: PT POC and goals, HEP Person educated: Patient Education method: Explanation, Demonstration, and Handouts Education comprehension: verbalized understanding and returned demonstration  HOME EXERCISE PROGRAM: Access Code: [email or web address] URL: [email or web address] Date: 06/12/2022 Prepared by: Isabelle Course  Exercises - Seated Pelvic Tilt  - 1 x daily - 7 x weekly - 3 sets - 10 reps - Seated Hamstring Stretch  - 1 x daily - 7 x weekly - 1 sets - 3 reps - 20-30 seconds hold - Supine Lower Trunk Rotation  - 1 x daily - 7 x weekly - 1 sets - 10 reps - 10 seconds hold - Supine Single Knee to Chest Stretch  - 1 x daily - 7 x weekly - 1 sets - 3 reps - 20-30 seconds hold - Anti-Rotation Press With Sidesteps and Anchored Resistance  - 1  x daily - 7 x weekly - 3 sets - 10 reps - Shoulder extension with resistance - Neutral  - 1 x daily - 7 x weekly - 3 sets - 10 reps - Standing Shoulder Row with Anchored Resistance  - 1 x daily - 7 x weekly - 3 sets - 10 reps - Standing Diagonal Chop  - 1 x daily - 7 x weekly - 3 sets - 10 reps - Sit to Stand Without Arm Support  - 1 x daily - 7 x weekly - 3 sets - 10 reps - Standing Side Plank on Wall  - 1 x daily - 7 x weekly - 1 sets - 3 reps - 10-15 seconds hold - Plank with Hands on Table  - 1 x daily - 7 x weekly - 1 sets - 3 reps - 10-15 seconds hold - Bear Plank from [employer]  - 1 x daily - 7 x weekly - 1 sets - 3 reps - 10-15 seconds hold - Bird Dog  - 1 x daily - 7 x weekly - 3 sets - 10 reps  ASSESSMENT:  CLINICAL IMPRESSION: Session focused on progressing HEP for core strengthening. Due to patient is self pay PT educated pt on various intensities of core exercises and how to  progress himself at home. Pt given updated HEP. Will hold therapy at this time    GOALS: Goals reviewed with patient? Yes  SHORT TERM  GOALS: Target date: 06/17/2022   Pt will be independent in initial HEP Baseline: Goal status: INITIAL   LONG TERM GOALS: Target date: 07/15/2022   Pt will be independent in advanced HEP Baseline:  Goal status: INITIAL  2.  Pt will improve FOTO to >= 74 to demo improved functional mobility Baseline:  Goal status: INITIAL  3.  Pt will perform full lumbar ROM with pain <= 1/10 Baseline:  Goal status: INITIAL  4.  Pt will tolerate standing and/or walking  1 hour with pain <= 1/10 Baseline:  Goal status: INITIAL   PLAN:  PT FREQUENCY: 1x/week  PT DURATION: 6 weeks  PLANNED INTERVENTIONS: Therapeutic exercises, Therapeutic activity, Neuromuscular re-education, Balance training, Gait training, Patient/Family education, Self Care, Joint mobilization, Aquatic Therapy, Dry Needling, Electrical stimulation, Cryotherapy, Moist heat, Taping, Traction, Ultrasound, Ionotophoresis '4mg'$ /ml Dexamethasone, Manual therapy, and Re-evaluation.  PLAN FOR NEXT SESSION: hold PT at this time, core strength and flexibility  PHYSICAL THERAPY DISCHARGE SUMMARY  Visits from Start of Care: 2  Current functional level related to goals / functional outcomes: Decreased pain, improved mobility   Remaining deficits: See above   Education / Equipment: HEP   Patient agrees to discharge. Patient goals were partially met. Patient is being discharged due to not returning since the last visit. Isabelle Course, PT,DPT03/06/244:04 PM  Najee Manninen, PT 06/12/2022, 9:30 AM

## 2022-10-05 ENCOUNTER — Ambulatory Visit (INDEPENDENT_AMBULATORY_CARE_PROVIDER_SITE_OTHER): Payer: BC Managed Care – PPO | Admitting: Family Medicine

## 2022-10-05 ENCOUNTER — Encounter: Payer: Self-pay | Admitting: Family Medicine

## 2022-10-05 VITALS — BP 122/76 | HR 71 | Ht 71.75 in | Wt 232.0 lb

## 2022-10-05 DIAGNOSIS — Z0000 Encounter for general adult medical examination without abnormal findings: Secondary | ICD-10-CM | POA: Diagnosis not present

## 2022-10-05 DIAGNOSIS — Z1211 Encounter for screening for malignant neoplasm of colon: Secondary | ICD-10-CM | POA: Diagnosis not present

## 2022-10-05 NOTE — Progress Notes (Signed)
Complete physical exam  Patient: Micheal Hines   DOB: Jun 20, 1975   47 y.o. Male  MRN: [id number]  Subjective:    Chief Complaint  Patient presents with   Annual Exam    Micheal Hines is a 47 y.o. male who presents today for a complete physical exam. He reports consuming a general diet. The patient does not participate in regular exercise at present. He generally feels fairly well. He reports sleeping well. He does not have additional problems to discuss today.    Most recent fall risk assessment:    10/05/2022    8:05 AM  Fall Risk   Falls in the past year? 0  Number falls in past yr: 0  Injury with Fall? 0  Risk for fall due to : No Fall Risks  Follow up Falls evaluation completed     Most recent depression screenings:    10/05/2022    8:05 AM 10/02/2021    9:12 AM  PHQ 2/9 Scores  PHQ - 2 Score 0 0        Patient Care Team: Agapito Games, MD as PCP - General (Family Medicine)   Outpatient Medications Prior to Visit  Medication Sig   AMBULATORY NON FORMULARY MEDICATION Supply ordered: CPAP and other supplies needed (headgear to fit, cushions, filters, heated tubing and water chamber) Dx: obstructive sleep apnea Settings: auto-titration 5-20 cmH2O   diclofenac sodium (VOLTAREN) 1 % GEL Apply 2 g topically 4 (four) times daily. To affected joint.   Multiple Vitamin (MULTIVITAMIN) capsule Take 1 capsule by mouth daily.   No facility-administered medications prior to visit.    ROS        Objective:     BP 122/76   Pulse 71   Ht 5' 11.75" (1.822 m)   Wt 232 lb (105.2 kg)   SpO2 98%   BMI 31.68 kg/m    Physical Exam Constitutional:      Appearance: He is well-developed.  HENT:     Head: Normocephalic and atraumatic.     Right Ear: Tympanic membrane, ear canal and external ear normal.     Left Ear: Tympanic membrane, ear canal and external ear normal.     Nose: Nose normal.     Mouth/Throat:     Pharynx: Oropharynx is clear.  Eyes:      Conjunctiva/sclera: Conjunctivae normal.     Pupils: Pupils are equal, round, and reactive to light.  Neck:     Thyroid: No thyromegaly.  Cardiovascular:     Rate and Rhythm: Normal rate and regular rhythm.     Heart sounds: Normal heart sounds.  Pulmonary:     Effort: Pulmonary effort is normal.     Breath sounds: Normal breath sounds.  Abdominal:     General: Bowel sounds are normal. There is no distension.     Palpations: Abdomen is soft. There is no mass.     Tenderness: There is no abdominal tenderness. There is no guarding or rebound.  Musculoskeletal:        General: Normal range of motion.     Cervical back: Normal range of motion and neck supple. No tenderness.  Lymphadenopathy:     Cervical: No cervical adenopathy.  Skin:    General: Skin is warm and dry.  Neurological:     Mental Status: He is alert and oriented to person, place, and time.     Deep Tendon Reflexes: Reflexes are normal and symmetric.  Psychiatric:  Behavior: Behavior normal.        Thought Content: Thought content normal.        Judgment: Judgment normal.      No results found for any visits on 10/05/22.     Assessment & Plan:    Routine Health Maintenance and Physical Exam  Immunization History  Administered Date(s) Administered   Influenza Whole 01/07/2011   Influenza,inj,Quad PF,6+ Mos 01/19/2013, 04/29/2017, 01/04/2018, 02/24/2021, 05/05/2022   Janssen (J&J) SARS-COV-2 Vaccination 07/14/2019   Tdap 12/04/2010, 09/04/2020    Health Maintenance  Topic Date Due   COVID-19 Vaccine (2 - 2023-24 season) 05/04/2023 (Originally 01/02/2022)   INFLUENZA VACCINE  12/03/2022   Colonoscopy  06/04/2023   DTaP/Tdap/Td (3 - Td or Tdap) 09/05/2030   Hepatitis C Screening  Completed   HIV Screening  Completed   Pneumococcal Vaccine 75-25 Years old  Aged Out   HPV VACCINES  Aged Out    Discussed health benefits of physical activity, and encouraged him to engage in regular exercise  appropriate for his age and condition.  Problem List Items Addressed This Visit   None Visit Diagnoses     Wellness examination    -  Primary   Relevant Orders   Lipid Panel w/reflex Direct LDL   CBC   COMPLETE METABOLIC PANEL WITH GFR   Hemoglobin A1c   Screen for colon cancer       Relevant Orders   Ambulatory referral to Gastroenterology       Keep up a regular exercise program and make sure you are eating a healthy diet Try to eat 4 servings of dairy a day, or if you are lactose intolerant take a calcium with vitamin D daily.  Your vaccines are up to date.   Return in about 1 year (around 10/05/2023) for Wellness Exam.     Nani Gasser, MD

## 2022-10-06 LAB — LIPID PANEL W/REFLEX DIRECT LDL
Cholesterol: 182 mg/dL (ref <200)
HDL: 32 mg/dL — ABNORMAL LOW (ref >=40)
LDL Cholesterol (Calc): 124 mg/dL (calc) — ABNORMAL HIGH
Non-HDL Cholesterol (Calc): 150 mg/dL (calc) — ABNORMAL HIGH (ref <130)
Total CHOL/HDL Ratio: 5.7 (calc) — ABNORMAL HIGH (ref <5.0)
Triglycerides: 148 mg/dL (ref <150)

## 2022-10-06 LAB — COMPLETE METABOLIC PANEL WITH GFR
AG Ratio: 1.9 (calc) (ref 1.0–2.5)
ALT: 21 U/L (ref 9–46)
AST: 20 U/L (ref 10–40)
Albumin: 4.5 g/dL (ref 3.6–5.1)
Alkaline phosphatase (APISO): 51 U/L (ref 36–130)
BUN: 16 mg/dL (ref 7–25)
CO2: 22 mmol/L (ref 20–32)
Calcium: 9.3 mg/dL (ref 8.6–10.3)
Chloride: 108 mmol/L (ref 98–110)
Creat: 0.98 mg/dL (ref 0.60–1.29)
Globulin: 2.4 g/dL (calc) (ref 1.9–3.7)
Glucose, Bld: 83 mg/dL (ref 65–99)
Potassium: 4.1 mmol/L (ref 3.5–5.3)
Sodium: 141 mmol/L (ref 135–146)
Total Bilirubin: 1.7 mg/dL — ABNORMAL HIGH (ref 0.2–1.2)
Total Protein: 6.9 g/dL (ref 6.1–8.1)
eGFR: 98 mL/min/{1.73_m2} (ref >=60)

## 2022-10-06 LAB — CBC
HCT: 45.6 % (ref 38.5–50.0)
Hemoglobin: 15.5 g/dL (ref 13.2–17.1)
MCH: 30.5 pg (ref 27.0–33.0)
MCHC: 34 g/dL (ref 32.0–36.0)
MCV: 89.6 fL (ref 80.0–100.0)
MPV: 11 fL (ref 7.5–12.5)
Platelets: 209 10*3/uL (ref 140–400)
RBC: 5.09 10*6/uL (ref 4.20–5.80)
RDW: 12.2 % (ref 11.0–15.0)
WBC: 5.6 10*3/uL (ref 3.8–10.8)

## 2022-10-06 LAB — HEMOGLOBIN A1C
Hgb A1c MFr Bld: 5.3 % of total Hgb (ref <5.7)
Mean Plasma Glucose: 105 mg/dL
eAG (mmol/L): 5.8 mmol/L

## 2022-10-06 NOTE — Progress Notes (Signed)
Deaf, metabolic panel is stable.  LDL cholesterol does look better than last years a great job in improving those numbers.  Continue to work on healthy diet and regular exercise.  Blood count and A1c look great.  The 10-year ASCVD risk score (Arnett DK, et al., 2019) is: 2.5%   Values used to calculate the score:     Age: 47 years     Sex: Male     Is Non-Hispanic African American: No     Diabetic: No     Tobacco smoker: No     Systolic Blood Pressure: 122 mmHg     Is BP treated: No     HDL Cholesterol: 32 mg/dL     Total Cholesterol: 182 mg/dL

## 2022-12-22 ENCOUNTER — Ambulatory Visit: Payer: BC Managed Care – PPO | Admitting: Gastroenterology

## 2022-12-22 ENCOUNTER — Encounter: Payer: Self-pay | Admitting: Gastroenterology

## 2022-12-22 VITALS — BP 122/78 | HR 95 | Ht 72.0 in | Wt 234.8 lb

## 2022-12-22 DIAGNOSIS — Z86010 Personal history of colonic polyps: Secondary | ICD-10-CM | POA: Diagnosis not present

## 2022-12-22 DIAGNOSIS — R198 Other specified symptoms and signs involving the digestive system and abdomen: Secondary | ICD-10-CM | POA: Diagnosis not present

## 2022-12-22 NOTE — Patient Instructions (Signed)
Please call our office back to schedule your colonoscopy and nurse visit.   You can take over the counter Gas-x three times a day for gas and bloating.   The [facility] providers would like to encourage you to use [facility] to communicate with providers for non-urgent requests or questions.  Due to long hold times on the telephone, sending your provider a message by [facility] may be a faster and more efficient way to get a response.  Please allow 48 business hours for a response.  Please remember that this is for non-urgent requests.   Thank you for choosing me and [facility].  Venita Lick. Pleas Koch., MD., Clementeen Graham

## 2022-12-22 NOTE — Progress Notes (Addendum)
Assessment    Rectal pressure, abdominal discomfort, gas, urgency - symptoms appear to be intestinal gas related  Lactose intolerant  History of Grade III internal hemorrhoids S/P banding Personal history of adenomatous colon polyps due for surveillance soon   Recommendations   Gas-X tid prn. Avoid foods, beverages that increase gas Schedule colonoscopy.  The risks (including bleeding, perforation, infection, missed lesions, medication reactions and possible hospitalization or surgery if complications occur), benefits, and alternatives to colonoscopy with possible biopsy and possible polypectomy were discussed with the patient and they consent to proceed.     HPI   Chief complaint: Change in bowel function, rectal pressure, gas, urgency  Patient profile:  Micheal Hines is a 47 y.o. male referred by Agapito Games, * MD with a change in bowel function, rectal pressure, gas, urgency for 2 months.  He relates a normal bowel movement in the morning followed by increased intestinal gas with rectal pressure and the urge to defecate.  Then he passes a significant amount of gas on the commode which relieves his symptoms.  Sometimes he has a small bowel movement with the gas.  He notes beginning breakfast smoothies about 2 months ago so he discontinued them for several days however his symptoms still occurred.  Symptoms occur most days of the week but not every day. tTG negative in 2018.  CBC, CMP in June 2024 unremarkable except for a minimally elevated total bilirubin at 1.7.  Denies weight loss, constipation, diarrhea, change in stool caliber, melena, hematochezia, nausea, vomiting, dysphagia, reflux symptoms, chest pain.   Previous Labs / Imaging::    Latest Ref Rng & Units 10/05/2022    8:31 AM 10/02/2021   12:00 AM 09/06/2020    8:06 AM  CBC  WBC 3.8 - 10.8 Thousand/uL 5.6  5.3  6.3   Hemoglobin 13.2 - 17.1 g/dL 16.1  09.6  04.5   Hematocrit 38.5 - 50.0 % 45.6  49.8  46.4    Platelets 140 - 400 Thousand/uL 209  227  188     Lab Results  Component Value Date   LIPASE 28 01/06/2018      Latest Ref Rng & Units 10/05/2022    8:31 AM 10/02/2021   12:00 AM 09/06/2020    8:06 AM  CMP  Glucose 65 - 99 mg/dL 83  86  81   BUN 7 - 25 mg/dL 16  13  13    Creatinine 0.60 - 1.29 mg/dL 4.09  8.11  9.14   Sodium 135 - 146 mmol/L 141  142  142   Potassium 3.5 - 5.3 mmol/L 4.1  4.3  4.3   Chloride 98 - 110 mmol/L 108  108  107   CO2 20 - 32 mmol/L 22  24  21    Calcium 8.6 - 10.3 mg/dL 9.3  9.4  9.5   Total Protein 6.1 - 8.1 g/dL 6.9  7.3  6.9   Total Bilirubin 0.2 - 1.2 mg/dL 1.7  1.0  1.3   AST 10 - 40 U/L 20  20  20    ALT 9 - 46 U/L 21  26  24       Previous GI evaluation    Endoscopies:  Colonoscopy Jan 2020 - The examined portion of the ileum was normal. Biopsied.  - Three 5 to 8 mm polyps in the rectum, in the transverse colon and in the ascending colon, removed with a cold snare. Resected and retrieved.  - Internal hemorrhoids.  Path: 2  TAs, 1HP. Random colon and TI biopsies were normal.   Imaging:     Past Medical History:  Diagnosis Date   COVID-19 virus infection 11/19/2020   Hyperlipidemia    Shingles    Sleep apnea 2017   on CPAP   Wears glasses    Past Surgical History:  Procedure Laterality Date   WISDOM TOOTH EXTRACTION     Family History  Problem Relation Age of Onset   Diabetes Father    Hyperlipidemia Father    Prostate cancer Maternal Grandfather    Breast cancer Paternal Grandmother    Colon cancer Neg Hx    Esophageal cancer Neg Hx    Rectal cancer Neg Hx    Stomach cancer Neg Hx    Social History   Tobacco Use   Smoking status: Never   Smokeless tobacco: Never  Vaping Use   Vaping status: Never Used  Substance Use Topics   Alcohol use: Yes    Comment: rarely   Drug use: No   Current Outpatient Medications  Medication Sig Dispense Refill   AMBULATORY NON FORMULARY MEDICATION Supply ordered: CPAP and other  supplies needed (headgear to fit, cushions, filters, heated tubing and water chamber) Dx: obstructive sleep apnea Settings: auto-titration 5-20 cmH2O 1 Units 99   diclofenac sodium (VOLTAREN) 1 % GEL Apply 2 g topically 4 (four) times daily. To affected joint. 100 g 11   Multiple Vitamin (MULTIVITAMIN) capsule Take 1 capsule by mouth daily.     No current facility-administered medications for this visit.   No Known Allergies  Review of Systems: All other systems reviewed and negative except where noted in HPI.    Physical Exam    Wt Readings from Last 3 Encounters:  12/22/22 234 lb 12.8 oz (106.5 kg)  10/05/22 232 lb (105.2 kg)  05/05/22 233 lb (105.7 kg)    BP 122/78   Pulse 95   Ht 6' (1.829 m)   Wt 234 lb 12.8 oz (106.5 kg)   BMI 31.84 kg/m  Constitutional:  Generally well appearing male in no acute distress. HEENT: Pupils normal.  Conjunctivae are normal. No scleral icterus. No oral lesions or deformities noted.  Neck: Supple.  Cardiac: Normal rate, regular rhythm without murmurs. Pulmonary/chest: Effort normal and breath sounds normal. No wheezing, rales or rhonchi. Abdominal: Soft, nondistended, nontender. Active bowel sounds. No palpable HSM, masses or hernias. Rectal: Deferred to colonoscopy Extremities: No edema or deformities noted Neurological: Alert and oriented to person, place and time. Psychiatric: Pleasant. Normal mood and affect. Behavior is normal. Skin: Skin is warm and dry. No rashes noted.  Claudette Head, MD   cc:  Referring Provider Agapito Games, * MD

## 2022-12-22 NOTE — Addendum Note (Signed)
Addended by: Claudette Head T on: 12/22/2022 11:14 AM   Modules accepted: Level of Service

## 2023-05-20 ENCOUNTER — Ambulatory Visit (INDEPENDENT_AMBULATORY_CARE_PROVIDER_SITE_OTHER): Payer: 59 | Admitting: Family Medicine

## 2023-05-20 ENCOUNTER — Encounter: Payer: Self-pay | Admitting: Family Medicine

## 2023-05-20 VITALS — BP 120/90 | HR 92 | Ht 72.0 in | Wt 233.8 lb

## 2023-05-20 DIAGNOSIS — R6889 Other general symptoms and signs: Secondary | ICD-10-CM

## 2023-05-20 DIAGNOSIS — J029 Acute pharyngitis, unspecified: Secondary | ICD-10-CM

## 2023-05-20 DIAGNOSIS — R0981 Nasal congestion: Secondary | ICD-10-CM | POA: Diagnosis not present

## 2023-05-20 DIAGNOSIS — H109 Unspecified conjunctivitis: Secondary | ICD-10-CM | POA: Insufficient documentation

## 2023-05-20 DIAGNOSIS — U071 COVID-19: Secondary | ICD-10-CM | POA: Diagnosis not present

## 2023-05-20 LAB — POCT RAPID STREP A (OFFICE): Rapid Strep A Screen: NEGATIVE

## 2023-05-20 LAB — POCT INFLUENZA A/B
Influenza A, POC: NEGATIVE
Influenza B, POC: NEGATIVE

## 2023-05-20 LAB — POC COVID19 BINAXNOW: SARS Coronavirus 2 Ag: POSITIVE — AB

## 2023-05-20 MED ORDER — POLYMYXIN B-TRIMETHOPRIM 10000-0.1 UNIT/ML-% OP SOLN: 2.0000 [drp] | OPHTHALMIC | 0 refills | Status: DC

## 2023-05-20 MED ORDER — NIRMATRELVIR/RITONAVIR (PAXLOVID)TABLET: 3.0000 | ORAL_TABLET | Freq: Two times a day (BID) | ORAL | 0 refills | Status: AC

## 2023-05-20 NOTE — Assessment & Plan Note (Addendum)
POC flu negative Poc covid positive

## 2023-05-20 NOTE — Assessment & Plan Note (Signed)
COVID positive  - given paxlovid  - gave RTC precautions. Lung exam was clear

## 2023-05-20 NOTE — Assessment & Plan Note (Signed)
Conjunctival injection bilaterally and with son being diagnosed along with patient feeling a matting feeling likely this is bacterial.

## 2023-05-20 NOTE — Progress Notes (Signed)
Established patient visit   Patient: Micheal Hines   DOB: 31-Dec-1975   48 y.o. Male  MRN: [id number] Visit Date: 05/20/2023  Today's healthcare provider: Charlton Amor, DO   Chief Complaint  Patient presents with   Conjunctivitis    PT STATES son was diagnosed yesterday with it and he woke up with puffy eyes. Pt also is complaining of sore throat. Coughing, and congestion    SUBJECTIVE    Chief Complaint  Patient presents with   Conjunctivitis    PT STATES son was diagnosed yesterday with it and he woke up with puffy eyes. Pt also is complaining of sore throat. Coughing, and congestion   HPI HPI     Conjunctivitis    Additional comments: PT STATES son was diagnosed yesterday with it and he woke up with puffy eyes. Pt also is complaining of sore throat. Coughing, and congestion      Last edited by Roselyn Reef, CMA on 05/20/2023  1:37 PM.       Pt presents with concerns of pink eye. Also says he has had a cold for a few days. Says his son was diagnosed with pink eye and given antibiotics yesterday.   Review of Systems  Constitutional:  Negative for activity change, fatigue and fever.  HENT:  Positive for congestion.   Respiratory:  Positive for cough. Negative for shortness of breath.   Cardiovascular:  Negative for chest pain.  Gastrointestinal:  Negative for abdominal pain.  Genitourinary:  Negative for difficulty urinating.       Current Meds  Medication Sig   AMBULATORY NON FORMULARY MEDICATION Supply ordered: CPAP and other supplies needed (headgear to fit, cushions, filters, heated tubing and water chamber) Dx: obstructive sleep apnea Settings: auto-titration 5-20 cmH2O   diclofenac sodium (VOLTAREN) 1 % GEL Apply 2 g topically 4 (four) times daily. To affected joint.   Multiple Vitamin (MULTIVITAMIN) capsule Take 1 capsule by mouth daily.   nirmatrelvir/ritonavir (PAXLOVID) 20 x 150 MG & 10 x 100MG  TABS Take 3 tablets by mouth 2 (two) times daily for  5 days. (Take nirmatrelvir 150 mg two tablets twice daily for 5 days and ritonavir 100 mg one tablet twice daily for 5 days) Patient GFR is 98   trimethoprim-polymyxin b (POLYTRIM) ophthalmic solution Place 2 drops into both eyes every 4 (four) hours.    OBJECTIVE    BP (!) 120/90 (BP Location: Left Arm, Patient Position: Sitting, Cuff Size: Large)   Pulse 92   Ht 6' (1.829 m)   Wt 233 lb 12 oz (106 kg)   SpO2 99%   BMI 31.70 kg/m   Physical Exam Vitals and nursing note reviewed.  Constitutional:      General: He is not in acute distress.    Appearance: Normal appearance.  HENT:     Head: Normocephalic and atraumatic.     Right Ear: External ear normal.     Left Ear: External ear normal.     Nose: Nose normal.  Eyes:     Conjunctiva/sclera: Conjunctivae normal.  Cardiovascular:     Rate and Rhythm: Normal rate and regular rhythm.  Pulmonary:     Effort: Pulmonary effort is normal.     Breath sounds: Normal breath sounds.  Neurological:     General: No focal deficit present.     Mental Status: He is alert and oriented to person, place, and time.  Psychiatric:        Mood and Affect: Mood  normal.        Behavior: Behavior normal.        Thought Content: Thought content normal.        Judgment: Judgment normal.        ASSESSMENT & PLAN    Problem List Items Addressed This Visit       Other   Bacterial conjunctivitis - Primary   Conjunctival injection bilaterally and with son being diagnosed along with patient feeling a matting feeling likely this is bacterial.        Relevant Medications   trimethoprim-polymyxin b (POLYTRIM) ophthalmic solution   nirmatrelvir/ritonavir (PAXLOVID) 20 x 150 MG & 10 x 100MG  TABS   Other Relevant Orders   POC COVID-19 (Completed)   Nasal congestion   POC flu negative Poc covid positive      Relevant Orders   POC COVID-19 (Completed)   POCT Influenza A/B (Completed)   POCT rapid strep A (Completed)   COVID-19   COVID  positive  - given paxlovid  - gave RTC precautions. Lung exam was clear      Relevant Medications   nirmatrelvir/ritonavir (PAXLOVID) 20 x 150 MG & 10 x 100MG  TABS   Other Relevant Orders   POC COVID-19 (Completed)   POCT Influenza A/B (Completed)   POCT rapid strep A (Completed)   Other Visit Diagnoses       Sore throat       Relevant Orders   POCT rapid strep A (Completed)     Flu-like symptoms       Relevant Orders   POCT Influenza A/B (Completed)       No follow-ups on file.      Meds ordered this encounter  Medications   trimethoprim-polymyxin b (POLYTRIM) ophthalmic solution    Sig: Place 2 drops into both eyes every 4 (four) hours.    Dispense:  10 mL    Refill:  0   nirmatrelvir/ritonavir (PAXLOVID) 20 x 150 MG & 10 x 100MG  TABS    Sig: Take 3 tablets by mouth 2 (two) times daily for 5 days. (Take nirmatrelvir 150 mg two tablets twice daily for 5 days and ritonavir 100 mg one tablet twice daily for 5 days) Patient GFR is 98    Dispense:  30 tablet    Refill:  0    Orders Placed This Encounter  Procedures   POC COVID-19    Previously tested for COVID-19:   Yes    Resident in a congregate (group) care setting:   Unknown    Employed in healthcare setting:   Unknown   POCT Influenza A/B   POCT rapid strep A     Charlton Amor, DO  [facility] at [facility] [phone] (phone) [phone] (fax)  [facility]

## 2023-05-21 ENCOUNTER — Encounter: Payer: Self-pay | Admitting: Family Medicine

## 2023-05-21 ENCOUNTER — Other Ambulatory Visit: Payer: Self-pay | Admitting: Family Medicine

## 2023-06-16 ENCOUNTER — Ambulatory Visit (INDEPENDENT_AMBULATORY_CARE_PROVIDER_SITE_OTHER): Payer: 59 | Admitting: Physician Assistant

## 2023-06-16 VITALS — BP 124/88 | HR 73 | Temp 98.0°F | Ht 72.0 in | Wt 237.0 lb

## 2023-06-16 DIAGNOSIS — R058 Other specified cough: Secondary | ICD-10-CM | POA: Diagnosis not present

## 2023-06-16 DIAGNOSIS — J40 Bronchitis, not specified as acute or chronic: Secondary | ICD-10-CM

## 2023-06-16 MED ORDER — BENZONATATE 200 MG PO CAPS: 200.0000 mg | ORAL_CAPSULE | Freq: Three times a day (TID) | ORAL | 0 refills | Status: DC | PRN

## 2023-06-16 MED ORDER — DEXAMETHASONE 4 MG PO TABS: 4.0000 mg | ORAL_TABLET | Freq: Two times a day (BID) | ORAL | 0 refills | Status: DC

## 2023-06-16 NOTE — Progress Notes (Signed)
Acute Office Visit  Subjective:     Patient ID: Micheal Hines, male    DOB: 21-Sep-1975, 48 y.o.   MRN: [id number]  Cough Associated symptoms include chest pain and shortness of breath. Pertinent negatives include no chills or fever.   Patient is a 48 yo male in today with a chief complain of cough and worsening congestion. He described the cough as dry. He is not feeling better since being diagnosed with COVID one month ago. He states his congestion has spread down deep into his chest. Endorses NO shortness or breath but he has a consistent cough, as well as chest pain when he coughs. He is not on any medications for his symptoms.  He denies any recent fever, sinus pressure, ear pain.   Review of Systems  Constitutional:  Negative for chills and fever.  HENT:  Positive for congestion.   Eyes: Negative.   Respiratory:  Positive for cough and shortness of breath.   Cardiovascular:  Positive for chest pain.  Gastrointestinal: Negative.   Genitourinary: Negative.   Musculoskeletal: Negative.       Objective:    BP 124/88   Pulse 73   Temp 98 F (36.7 C) (Oral)   Ht 6' (1.829 m)   Wt 237 lb (107.5 kg)   SpO2 99%   BMI 32.14 kg/m  BP Readings from Last 3 Encounters:  06/16/23 124/88  05/20/23 (!) 120/90  12/22/22 122/78   Wt Readings from Last 3 Encounters:  06/16/23 237 lb (107.5 kg)  05/20/23 233 lb 12 oz (106 kg)  12/22/22 234 lb 12.8 oz (106.5 kg)   Physical Exam Constitutional:      Appearance: Normal appearance.  HENT:     Head: Normocephalic and atraumatic.     Right Ear: Tympanic membrane normal.     Left Ear: Tympanic membrane normal.     Mouth/Throat:     Mouth: Mucous membranes are dry.  Eyes:     Extraocular Movements: Extraocular movements intact.  Cardiovascular:     Rate and Rhythm: Normal rate and regular rhythm.     Pulses: Normal pulses.     Heart sounds: Normal heart sounds.  Pulmonary:     Effort: Pulmonary effort is normal.     Breath  sounds: Normal breath sounds.  Chest:     Chest wall: Tenderness present.  Musculoskeletal:     Cervical back: Neck supple.  Neurological:     Mental Status: He is alert.  Psychiatric:        Mood and Affect: Mood normal.       Assessment & Plan:  Marland KitchenMarland KitchenSonya "Trey Paula" was seen today for cough.  Diagnoses and all orders for this visit:  Bronchitis -     benzonatate (TESSALON) 200 MG capsule; Take 1 capsule (200 mg total) by mouth 3 (three) times daily as needed. -     dexamethasone (DECADRON) 4 MG tablet; Take 1 tablet (4 mg total) by mouth 2 (two) times daily with a meal.  Post-viral cough syndrome -     benzonatate (TESSALON) 200 MG capsule; Take 1 capsule (200 mg total) by mouth 3 (three) times daily as needed. -     dexamethasone (DECADRON) 4 MG tablet; Take 1 tablet (4 mg total) by mouth 2 (two) times daily with a meal.  Lungs sounded good Vitals reassuring Decadron to start for post viral cough/bronchitis Tessalon pearls to use as needed Continue cPAP If not improving consider CXR before the weekend  [facility],  PA-C

## 2023-06-16 NOTE — Patient Instructions (Signed)

## 2023-06-19 ENCOUNTER — Emergency Department (HOSPITAL_BASED_OUTPATIENT_CLINIC_OR_DEPARTMENT_OTHER)
Admission: EM | Admit: 2023-06-19 | Discharge: 2023-06-19 | Disposition: A | Discharge status: Home / Self Care | Payer: 59 | Attending: Emergency Medicine | Admitting: Emergency Medicine

## 2023-06-19 ENCOUNTER — Other Ambulatory Visit: Payer: Self-pay

## 2023-06-19 ENCOUNTER — Encounter (HOSPITAL_BASED_OUTPATIENT_CLINIC_OR_DEPARTMENT_OTHER): Payer: Self-pay | Admitting: Emergency Medicine

## 2023-06-19 ENCOUNTER — Emergency Department (HOSPITAL_BASED_OUTPATIENT_CLINIC_OR_DEPARTMENT_OTHER): Payer: 59

## 2023-06-19 DIAGNOSIS — Z79899 Other long term (current) drug therapy: Secondary | ICD-10-CM | POA: Insufficient documentation

## 2023-06-19 DIAGNOSIS — R058 Other specified cough: Secondary | ICD-10-CM

## 2023-06-19 DIAGNOSIS — R0602 Shortness of breath: Secondary | ICD-10-CM | POA: Diagnosis not present

## 2023-06-19 DIAGNOSIS — R072 Precordial pain: Secondary | ICD-10-CM | POA: Diagnosis present

## 2023-06-19 DIAGNOSIS — Z20822 Contact with and (suspected) exposure to covid-19: Secondary | ICD-10-CM | POA: Diagnosis not present

## 2023-06-19 DIAGNOSIS — J40 Bronchitis, not specified as acute or chronic: Secondary | ICD-10-CM

## 2023-06-19 LAB — CBC WITH DIFFERENTIAL/PLATELET
Abs Immature Granulocytes: 0.08 10*3/uL — ABNORMAL HIGH (ref 0.00–0.07)
Basophils Absolute: 0 10*3/uL (ref 0.0–0.1)
Basophils Relative: 0 %
Eosinophils Absolute: 0 10*3/uL (ref 0.0–0.5)
Eosinophils Relative: 0 %
HCT: 42.8 % (ref 39.0–52.0)
Hemoglobin: 14.9 g/dL (ref 13.0–17.0)
Immature Granulocytes: 1 %
Lymphocytes Relative: 16 %
Lymphs Abs: 1.8 10*3/uL (ref 0.7–4.0)
MCH: 30.6 pg (ref 26.0–34.0)
MCHC: 34.8 g/dL (ref 30.0–36.0)
MCV: 87.9 fL (ref 80.0–100.0)
Monocytes Absolute: 0.8 10*3/uL (ref 0.1–1.0)
Monocytes Relative: 7 %
Neutro Abs: 8.1 10*3/uL — ABNORMAL HIGH (ref 1.7–7.7)
Neutrophils Relative %: 76 %
Platelets: 227 10*3/uL (ref 150–400)
RBC: 4.87 MIL/uL (ref 4.22–5.81)
RDW: 12.8 % (ref 11.5–15.5)
WBC: 10.8 10*3/uL — ABNORMAL HIGH (ref 4.0–10.5)
nRBC: 0 % (ref 0.0–0.2)

## 2023-06-19 LAB — BASIC METABOLIC PANEL
Anion gap: 9 (ref 5–15)
BUN: 22 mg/dL — ABNORMAL HIGH (ref 6–20)
CO2: 23 mmol/L (ref 22–32)
Calcium: 8.9 mg/dL (ref 8.9–10.3)
Chloride: 105 mmol/L (ref 98–111)
Creatinine, Ser: 1 mg/dL (ref 0.61–1.24)
GFR, Estimated: >60 mL/min (ref >60)
Glucose, Bld: 133 mg/dL — ABNORMAL HIGH (ref 70–99)
Potassium: 3.6 mmol/L (ref 3.5–5.1)
Sodium: 137 mmol/L (ref 135–145)

## 2023-06-19 LAB — TROPONIN I (HIGH SENSITIVITY): Troponin I (High Sensitivity): 2 ng/L (ref <18)

## 2023-06-19 LAB — RESP PANEL BY RT-PCR (RSV, FLU A&B, COVID)  RVPGX2
Influenza A by PCR: NEGATIVE
Influenza B by PCR: NEGATIVE
Resp Syncytial Virus by PCR: NEGATIVE
SARS Coronavirus 2 by RT PCR: NEGATIVE

## 2023-06-19 NOTE — ED Notes (Signed)
Not needed -EDP

## 2023-06-19 NOTE — ED Provider Notes (Signed)
[place] [facility] Provider Note   CSN: [id number] Arrival date & time: 06/19/23  1835     History  Chief Complaint  Patient presents with   Shortness of Breath   Chest Pain    Micheal Hines is a 48 y.o. male.  Patient with history of OSA, hyperlipidemia presents today with complaints of chest pain, shortness of breath. He states that same began yesterday and has been persistent since then. States that pain is generalized throughout his chest and does not radiate. It seems to be worse when he is standing up and gets better when he lays down and sleeps. He also endorses generalized malaise over the past 2 days. States that all he wants to do is sleep. States that he did have covid 3 weeks ago and has had a persistent cough since then, however he has been generally well otherwise until yesterday. He was seen 3 days ago for management of his persistent cough and was prescribed steroids and Tessalon which he has been taking as prescribed with some improvement of his cough.  Denies any fevers or chills. Denies leg pain or leg swelling. No recent travel or recent surgeries. No history of blood clots or malignancy. He is able to sleep laying flat and is not waking in the night short of breath.  He any cardiac history.  He does not smoke and denies recreational or IVDU.  The history is provided by the patient. No language interpreter was used.  Shortness of Breath Associated symptoms: chest pain   Chest Pain Associated symptoms: shortness of breath        Home Medications Prior to Admission medications   Medication Sig Start Date End Date Taking? Authorizing Provider  AMBULATORY NON FORMULARY MEDICATION Supply ordered: CPAP and other supplies needed (headgear to fit, cushions, filters, heated tubing and water chamber) Dx: obstructive sleep apnea Settings: auto-titration 5-20 cmH2O 09/13/20   Sunnie Nielsen, DO  benzonatate (TESSALON) 200 MG  capsule Take 1 capsule (200 mg total) by mouth 3 (three) times daily as needed. 06/16/23   Breeback, Jade L, PA-C  dexamethasone (DECADRON) 4 MG tablet Take 1 tablet (4 mg total) by mouth 2 (two) times daily with a meal. 06/16/23   Breeback, Jade L, PA-C  diclofenac sodium (VOLTAREN) 1 % GEL Apply 2 g topically 4 (four) times daily. To affected joint. 04/29/17   Rodolph Bong, MD  Multiple Vitamin (MULTIVITAMIN) capsule Take 1 capsule by mouth daily.    [provider]  trimethoprim-polymyxin b (POLYTRIM) ophthalmic solution Place 2 drops into both eyes every 4 (four) hours. 05/20/23   Charlton Amor, DO      Allergies    Patient has no known allergies.    Review of Systems   Review of Systems  Respiratory:  Positive for shortness of breath.   Cardiovascular:  Positive for chest pain.  All other systems reviewed and are negative.   Physical Exam Updated Vital Signs BP 123/83   Pulse (!) 50   Temp 98.1 F (36.7 C) (Oral)   Resp 16   Ht 6' (1.829 m)   Wt 107.5 kg   SpO2 98%   BMI 32.14 kg/m  Physical Exam Vitals and nursing note reviewed.  Constitutional:      General: He is not in acute distress.    Appearance: Normal appearance. He is normal weight. He is not ill-appearing, toxic-appearing or diaphoretic.  HENT:     Head: Normocephalic and atraumatic.  Cardiovascular:     Rate and Rhythm: Normal rate and regular rhythm.     Heart sounds: Normal heart sounds.  Pulmonary:     Effort: Pulmonary effort is normal. No respiratory distress.     Breath sounds: Normal breath sounds.  Chest:     Chest wall: No tenderness.  Abdominal:     Palpations: Abdomen is soft.     Tenderness: There is no abdominal tenderness.  Musculoskeletal:        General: Normal range of motion.     Cervical back: Normal range of motion.     Right lower leg: No tenderness. No edema.     Left lower leg: No tenderness. No edema.  Skin:    General: Skin is warm and dry.  Neurological:      General: No focal deficit present.     Mental Status: He is alert.  Psychiatric:        Mood and Affect: Mood normal.        Behavior: Behavior normal.     ED Results / Procedures / Treatments   Labs (all labs ordered are listed, but only abnormal results are displayed) Labs Reviewed  BASIC METABOLIC PANEL - Abnormal; Notable for the following components:      Result Value   Glucose, Bld 133 (*)    BUN 22 (*)    All other components within normal limits  CBC WITH DIFFERENTIAL/PLATELET - Abnormal; Notable for the following components:   WBC 10.8 (*)    Neutro Abs 8.1 (*)    Abs Immature Granulocytes 0.08 (*)    All other components within normal limits  RESP PANEL BY RT-PCR (RSV, FLU A&B, COVID)  RVPGX2  TROPONIN I (HIGH SENSITIVITY)  TROPONIN I (HIGH SENSITIVITY)    EKG None  Radiology DG Chest 2 View Result Date: 06/19/2023 CLINICAL DATA:  Chest pain. EXAM: CHEST - 2 VIEW COMPARISON:  Chest radiograph dated 02/13/2018. FINDINGS: The heart size and mediastinal contours are within normal limits. Both lungs are clear. Degenerative changes are seen in the spine. IMPRESSION: No active cardiopulmonary disease. Electronically Signed   By: Romona Curls M.D.   On: 06/19/2023 19:19    Procedures Procedures    Medications Ordered in ED Medications - No data to display  ED Course/ Medical Decision Making/ A&P                                 Medical Decision Making Amount and/or Complexity of Data Reviewed Labs: ordered. Radiology: ordered.   This patient is a 48 y.o. male who presents to the ED for concern of chest pain, shortness of breath, this involves an extensive number of treatment options, and is a complaint that carries with it a high risk of complications and morbidity. The emergent differential diagnosis prior to evaluation includes, but is not limited to,  ACS, pericarditis, myocarditis, aortic dissection, PE, pneumothorax, esophageal rupture, pneumonia,  reflux/PUD, biliary disease, pancreatitis, costochondritis, anxiety, CHF, pericardial effusion/tamponade, arrhythmias, COPD, asthma, bronchitis, anemia  This is not an exhaustive differential.   Past Medical History / Co-morbidities / Social History:  has a past medical history of COVID-19 virus infection (11/19/2020), Hyperlipidemia, Shingles, Sleep apnea (2017), and Wears glasses.  Additional history: Chart reviewed. Pertinent results include: Patient seen on 2/12 at urgent care, diagnosed with postviral cough syndrome and bronchitis and prescribed Tessalon and dexamethasone symptoms.  Physical Exam: Physical exam performed. The pertinent  findings include: No acute physical exam abnormalities. Patient is notably bradycardic, however likely baseline.  Lab Tests: I ordered, and personally interpreted labs.  The pertinent results include:  WBC 10.8, troponin WNL. Glucose 122, BUN 22.  No other acute laboratory abnormalities   Imaging Studies: I ordered imaging studies including CXR. I independently visualized and interpreted imaging which showed NAD. I agree with the radiologist interpretation.   Cardiac Monitoring:  The patient was maintained on a cardiac monitor.  My attending physician Dr. Doran Durand viewed and interpreted the cardiac monitored which showed an underlying rhythm of: no STEMI, benign early repolarization. bradycardia. I agree with this interpretation.   Disposition: After consideration of the diagnostic results and the patients response to treatment, I feel that emergency department workup does not suggest an emergent condition requiring admission or immediate intervention beyond what has been performed at this time. The plan is: Discharge with close outpatient follow-up and return precautions.  Patient's workup is benign.  His heart score is 2, low risk.  He is PERC negative.  Initially I did have some concern for pericarditis or myocarditis given his symptoms following a  URI, however his troponin and EKG are negative. Symptoms likely sequela from his COVID that he has 3 weeks ago. Recommend he complete the steroids, symptoms could be related to side effects from same, however he only has 1 more day of medication left. Evaluation and diagnostic testing in the emergency department does not suggest an emergent condition requiring admission or immediate intervention beyond what has been performed at this time.  Plan for discharge with close PCP follow-up.  Patient is understanding and amenable with plan, educated on red flag symptoms that would prompt immediate return.  Patient discharged in stable condition.   Final Clinical Impression(s) / ED Diagnoses Final diagnoses:  Precordial chest pain    Rx / DC Orders ED Discharge Orders     None     An After Visit Summary was printed and given to the patient.     Vear Clock 06/19/23 2350    Glyn Ade, MD 06/20/23 [phone]

## 2023-06-19 NOTE — ED Triage Notes (Signed)
Pt dx with bronchitis 3d ago (hx of Covid 3 wks ago); currently taking steroids and tessalon; yesterday began feeling SHOB and chest tightness; feels it is worse today

## 2023-06-19 NOTE — Discharge Instructions (Signed)
As we discussed, your workup in the ER today was reassuring for acute findings.  Laboratory evaluation, chest x-ray, and EKG did not reveal any emergent cause of your symptoms.  I suspect that your symptoms are related to your recent viral illness.  I recommend that you complete the course of steroids and keep taking the Tessalon and follow-up closely with your primary care doctor.  Return if development of any new or worsening symptoms.

## 2023-06-19 NOTE — ED Notes (Signed)
 Patient transported to X-ray

## 2023-07-01 ENCOUNTER — Encounter: Payer: Self-pay | Admitting: Pediatrics

## 2023-07-20 ENCOUNTER — Ambulatory Visit (AMBULATORY_SURGERY_CENTER): Payer: 59

## 2023-07-20 VITALS — Ht 72.0 in | Wt 230.0 lb

## 2023-07-20 DIAGNOSIS — Z860100 Personal history of colon polyps, unspecified: Secondary | ICD-10-CM

## 2023-07-20 MED ORDER — SUFLAVE 178.7 G PO SOLR: 1.0000 | Freq: Once | ORAL | 0 refills | Status: AC

## 2023-07-20 NOTE — Progress Notes (Signed)

## 2023-08-11 ENCOUNTER — Encounter: Payer: Self-pay | Admitting: Pediatrics

## 2023-08-12 NOTE — Progress Notes (Unsigned)
 [facility] History and Physical   Primary Care Physician:  Agapito Games, MD   Reason for Procedure:  Surveillance of adenomatous colon polyps  Plan:    Surveillance colonoscopy    HPI: Micheal Hines is a 48 y.o. male undergoing surveillance colonoscopy for follow-up of a history of adenomatous colon polyps.  Patient underwent colonoscopy in 2020 revealing 2 tubular adenomas and 1 hyperplastic polyp.  A 5-year follow-up was recommended.  No family history of colon cancer or colon polyps.  No change in bowel habits or rectal bleeding.   Past Medical History:  Diagnosis Date   Allergy    COVID-19 virus infection 11/19/2020   GERD (gastroesophageal reflux disease)    Hyperlipidemia    Shingles    Sleep apnea 2017   on CPAP   Wears glasses     Past Surgical History:  Procedure Laterality Date   WISDOM TOOTH EXTRACTION      Prior to Admission medications   Medication Sig Start Date End Date Taking? Authorizing Provider  AMBULATORY NON FORMULARY MEDICATION Supply ordered: CPAP and other supplies needed (headgear to fit, cushions, filters, heated tubing and water chamber) Dx: obstructive sleep apnea Settings: auto-titration 5-20 cmH2O 09/13/20   Sunnie Nielsen, DO  benzonatate (TESSALON) 200 MG capsule Take 1 capsule (200 mg total) by mouth 3 (three) times daily as needed. Patient not taking: Reported on 07/20/2023 06/16/23   Jomarie Longs, PA-C  diclofenac sodium (VOLTAREN) 1 % GEL Apply 2 g topically 4 (four) times daily. To affected joint. 04/29/17   Rodolph Bong, MD  Multiple Vitamin (MULTIVITAMIN) capsule Take 1 capsule by mouth daily.    [provider]    Current Outpatient Medications  Medication Sig Dispense Refill   AMBULATORY NON FORMULARY MEDICATION Supply ordered: CPAP and other supplies needed (headgear to fit, cushions, filters, heated tubing and water chamber) Dx: obstructive sleep apnea Settings: auto-titration 5-20 cmH2O  1 Units 99   benzonatate (TESSALON) 200 MG capsule Take 1 capsule (200 mg total) by mouth 3 (three) times daily as needed. (Patient not taking: Reported on 07/20/2023) 30 capsule 0   diclofenac sodium (VOLTAREN) 1 % GEL Apply 2 g topically 4 (four) times daily. To affected joint. 100 g 11   Multiple Vitamin (MULTIVITAMIN) capsule Take 1 capsule by mouth daily.     No current facility-administered medications for this visit.    Allergies as of 08/13/2023   (No Known Allergies)    Family History  Problem Relation Age of Onset   Diabetes Father    Hyperlipidemia Father    Prostate cancer Maternal Grandfather    Breast cancer Paternal Grandmother    Colon cancer Neg Hx    Esophageal cancer Neg Hx    Rectal cancer Neg Hx    Stomach cancer Neg Hx    Colon polyps Neg Hx     Social History   Socioeconomic History   Marital status: Married    Spouse name: Not on file   Number of children: Not on file   Years of education: Not on file   Highest education level: Bachelor's degree (e.g., BA, AB, BS)  Occupational History   Occupation: IT  Tobacco Use   Smoking status: Never   Smokeless tobacco: Never  Vaping Use   Vaping status: Never Used  Substance and Sexual Activity   Alcohol use: Yes    Comment: rarely   Drug use: No   Sexual activity: Yes    Birth control/protection: Pill  Other Topics Concern   Not on file  Social History Narrative   Not on file   Social Drivers of Health   Financial Resource Strain: Low Risk  (06/16/2023)   Overall Financial Resource Strain (CARDIA)    Difficulty of Paying Living Expenses: Not hard at all  Food Insecurity: No Food Insecurity (06/16/2023)   Hunger Vital Sign    Worried About Running Out of Food in the Last Year: Never true    Ran Out of Food in the Last Year: Never true  Transportation Needs: No Transportation Needs (06/16/2023)   PRAPARE - Administrator, Civil Service (Medical): No    Lack of Transportation  (Non-Medical): No  Physical Activity: Unknown (06/16/2023)   Exercise Vital Sign    Days of Exercise per Week: Patient declined    Minutes of Exercise per Session: Not on file  Stress: Stress Concern Present (06/16/2023)   [employer] of Occupational Health - Occupational Stress Questionnaire    Feeling of Stress : To some extent  Social Connections: Moderately Isolated (06/16/2023)   Social Connection and Isolation Panel [NHANES]    Frequency of Communication with Friends and Family: More than three times a week    Frequency of Social Gatherings with Friends and Family: Once a week    Attends Religious Services: Never    Database administrator or Organizations: No    Attends Engineer, structural: Not on file    Marital Status: Married  Catering manager Violence: Not on file    Review of Systems:  All other review of systems negative except as mentioned in the HPI.  Physical Exam: Vital signs There were no vitals taken for this visit.  General:   Alert,  Well-developed, well-nourished, pleasant and cooperative in NAD Airway:  Mallampati  Lungs:  Clear throughout to auscultation.   Heart:  Regular rate and rhythm; no murmurs, clicks, rubs,  or gallops. Abdomen:  Soft, nontender and nondistended. Normal bowel sounds.   Neuro/Psych:  Normal mood and affect. A and O x 3  Maren Beach, MD [facility]

## 2023-08-13 ENCOUNTER — Ambulatory Visit (AMBULATORY_SURGERY_CENTER): Payer: 59 | Admitting: Pediatrics

## 2023-08-13 ENCOUNTER — Encounter: Payer: Self-pay | Admitting: Pediatrics

## 2023-08-13 VITALS — BP 125/72 | HR 65 | Temp 97.3°F | Resp 11 | Ht 72.0 in | Wt 230.0 lb

## 2023-08-13 DIAGNOSIS — D128 Benign neoplasm of rectum: Secondary | ICD-10-CM

## 2023-08-13 DIAGNOSIS — K635 Polyp of colon: Secondary | ICD-10-CM | POA: Diagnosis not present

## 2023-08-13 DIAGNOSIS — Z1211 Encounter for screening for malignant neoplasm of colon: Secondary | ICD-10-CM | POA: Diagnosis present

## 2023-08-13 DIAGNOSIS — D125 Benign neoplasm of sigmoid colon: Secondary | ICD-10-CM

## 2023-08-13 DIAGNOSIS — K649 Unspecified hemorrhoids: Secondary | ICD-10-CM

## 2023-08-13 DIAGNOSIS — Z860101 Personal history of adenomatous and serrated colon polyps: Secondary | ICD-10-CM

## 2023-08-13 DIAGNOSIS — Z860100 Personal history of colon polyps, unspecified: Secondary | ICD-10-CM

## 2023-08-13 MED ORDER — SODIUM CHLORIDE 0.9 % IV SOLN: 500.0000 mL | Freq: Once | INTRAVENOUS | Status: DC

## 2023-08-13 NOTE — Op Note (Signed)
 [facility] Patient Name: Micheal Hines Procedure Date: 08/13/2023 9:54 AM MRN: [id number] Endoscopist: Maren Beach , MD, [id number] Age: 48 Referring MD:  Date of Birth: 01/30/1976 Gender: Male Account #: [id number] Procedure:                Colonoscopy Indications:              High risk colon cancer surveillance: Personal                            history of non-advanced adenoma, Last colonoscopy:                            January 2020 Medicines:                Monitored Anesthesia Care Procedure:                Pre-Anesthesia Assessment:                           - Prior to the procedure, a History and Physical                            was performed, and patient medications and                            allergies were reviewed. The patient's tolerance of                            previous anesthesia was also reviewed. The risks                            and benefits of the procedure and the sedation                            options and risks were discussed with the patient.                            All questions were answered, and informed consent                            was obtained. Prior Anticoagulants: The patient has                            taken no anticoagulant or antiplatelet agents. ASA                            Grade Assessment: II - A patient with mild systemic                            disease. After reviewing the risks and benefits,                            the patient was deemed in satisfactory condition to  undergo the procedure.                           After obtaining informed consent, the colonoscope                            was passed under direct vision. Throughout the                            procedure, the patient's blood pressure, pulse, and                            oxygen saturations were monitored continuously. The                            Colonoscope was introduced through the anus and                             advanced to the terminal ileum. The colonoscopy was                            performed without difficulty. The patient tolerated                            the procedure well. The quality of the bowel                            preparation was good. The terminal ileum, ileocecal                            valve, appendiceal orifice, and rectum were                            photographed. Scope In: 10:04:19 AM Scope Out: 10:18:51 AM Scope Withdrawal Time: 0 hours 11 minutes 8 seconds  Total Procedure Duration: 0 hours 14 minutes 32 seconds  Findings:                 The perianal and digital rectal examinations were                            normal. Pertinent negatives include normal                            sphincter tone and no palpable rectal lesions.                           Two sessile polyps were found in the recto-sigmoid                            colon. The polyps were 3 to 4 mm in size. These                            polyps were removed with a cold snare. Resection  and retrieval were complete.                           The exam was otherwise normal throughout the                            examined colon.                           The terminal ileum appeared normal.                           Hemorrhoids were found during retroflexion. Complications:            No immediate complications. Estimated blood loss:                            Minimal. Estimated Blood Loss:     Estimated blood loss was minimal. Impression:               - Two 3 to 4 mm polyps at the recto-sigmoid colon,                            removed with a cold snare. Resected and retrieved.                           - The examined portion of the ileum was normal.                           - Hemorrhoids. Recommendation:           - Discharge patient to home (ambulatory).                           - Await pathology results.                           -  Repeat colonoscopy for surveillance based on                            pathology results.                           - The findings and recommendations were discussed                            with the patient's family.                           - Return to referring physician.                           - Patient has a contact number available for                            emergencies. The signs and symptoms of potential  delayed complications were discussed with the                            patient. Return to normal activities tomorrow.                            Written discharge instructions were provided to the                            patient. Maren Beach, MD 08/13/2023 10:23:45 AM This report has been signed electronically.

## 2023-08-13 NOTE — Progress Notes (Signed)
 To pacu, VSS. Report to [email or web address]

## 2023-08-13 NOTE — Patient Instructions (Signed)
-  Handout on polyps and hemorrhoids provided -await pathology results -repeat colonoscopy for surveillance recommended. Date to be determined when pathology result become available    YOU HAD AN ENDOSCOPIC PROCEDURE TODAY AT THE [facility]:   Refer to the procedure report that was given to you for any specific questions about what was found during the examination.  If the procedure report does not answer your questions, please call your gastroenterologist to clarify.  If you requested that your care partner not be given the details of your procedure findings, then the procedure report has been included in a sealed envelope for you to review at your convenience later.  YOU SHOULD EXPECT: Some feelings of bloating in the abdomen. Passage of more gas than usual.  Walking can help get rid of the air that was put into your GI tract during the procedure and reduce the bloating. If you had a lower endoscopy (such as a colonoscopy or flexible sigmoidoscopy) you may notice spotting of blood in your stool or on the toilet paper. If you underwent a bowel prep for your procedure, you may not have a normal bowel movement for a few days.  Please Note:  You might notice some irritation and congestion in your nose or some drainage.  This is from the oxygen used during your procedure.  There is no need for concern and it should clear up in a day or so.  SYMPTOMS TO REPORT IMMEDIATELY:  Following lower endoscopy (colonoscopy or flexible sigmoidoscopy):  Excessive amounts of blood in the stool  Significant tenderness or worsening of abdominal pains  Swelling of the abdomen that is new, acute  Fever of 100F or higher  For urgent or emergent issues, a gastroenterologist can be reached at any hour by calling [phone]. Do not use MyChart messaging for urgent concerns.    DIET:  We do recommend a small meal at first, but then you may proceed to your regular diet.  Drink plenty of fluids but you  should avoid alcoholic beverages for 24 hours.  ACTIVITY:  You should plan to take it easy for the rest of today and you should NOT DRIVE or use heavy machinery until tomorrow (because of the sedation medicines used during the test).    FOLLOW UP: Our staff will call the number listed on your records the next business day following your procedure.  We will call around 7:15- 8:00 am to check on you and address any questions or concerns that you may have regarding the information given to you following your procedure. If we do not reach you, we will leave a message.     If any biopsies were taken you will be contacted by phone or by letter within the next 1-3 weeks.  Please call us at [phone] if you have not heard about the biopsies in 3 weeks.    SIGNATURES/CONFIDENTIALITY: You and/or your care partner have signed paperwork which will be entered into your electronic medical record.  These signatures attest to the fact that that the information above on your After Visit Summary has been reviewed and is understood.  Full responsibility of the confidentiality of this discharge information lies with you and/or your care-partner.

## 2023-08-13 NOTE — Progress Notes (Signed)
 Pt's states no medical or surgical changes since previsit or office visit.

## 2023-08-13 NOTE — Progress Notes (Signed)
 Called to room to assist during endoscopic procedure.  Patient ID and intended procedure confirmed with present staff. Received instructions for my participation in the procedure from the performing physician.

## 2023-08-17 ENCOUNTER — Telehealth: Payer: Self-pay

## 2023-08-17 ENCOUNTER — Encounter: Payer: Self-pay | Admitting: Pediatrics

## 2023-08-17 LAB — SURGICAL PATHOLOGY

## 2023-08-17 NOTE — Telephone Encounter (Signed)
  Follow up Call-     08/13/2023    9:40 AM  Call back number  Post procedure Call Back phone  # [phone]  Permission to leave phone message Yes     Patient questions:  Do you have a fever, pain , or abdominal swelling? No. Pain Score  0 *  Have you tolerated food without any problems? Yes.    Have you been able to return to your normal activities? Yes.    Do you have any questions about your discharge instructions: Diet   No. Medications  No. Follow up visit  No.  Do you have questions or concerns about your Care? No.  Actions: * If pain score is 4 or above: No action needed, pain <4.

## 2023-10-06 ENCOUNTER — Encounter: Payer: BC Managed Care – PPO | Admitting: Family Medicine

## 2023-10-12 ENCOUNTER — Encounter: Payer: Self-pay | Admitting: Family Medicine

## 2023-10-12 ENCOUNTER — Ambulatory Visit (INDEPENDENT_AMBULATORY_CARE_PROVIDER_SITE_OTHER): Payer: BC Managed Care – PPO | Admitting: Family Medicine

## 2023-10-12 VITALS — BP 130/89 | HR 72 | Ht 72.0 in | Wt 238.1 lb

## 2023-10-12 DIAGNOSIS — Z0000 Encounter for general adult medical examination without abnormal findings: Secondary | ICD-10-CM

## 2023-10-12 DIAGNOSIS — K649 Unspecified hemorrhoids: Secondary | ICD-10-CM

## 2023-10-12 DIAGNOSIS — L918 Other hypertrophic disorders of the skin: Secondary | ICD-10-CM | POA: Diagnosis not present

## 2023-10-12 DIAGNOSIS — N528 Other male erectile dysfunction: Secondary | ICD-10-CM | POA: Diagnosis not present

## 2023-10-12 NOTE — Progress Notes (Signed)
 Complete physical exam  Patient: Micheal Hines   DOB: Sep 17, 1975   47 y.o. Male  MRN: [id number]  Subjective:     Chief Complaint  Patient presents with   Annual Exam    Micheal Hines is a 48 y.o. male who presents today for a complete physical exam. He reports consuming a general diet. The patient does not participate in regular exercise at present. Stress eating. He generally feels well. He does have additional problems to discuss today.    Most recent fall risk assessment:    10/05/2022    8:05 AM  Fall Risk   Falls in the past year? 0  Number falls in past yr: 0  Injury with Fall? 0  Risk for fall due to : No Fall Risks  Follow up Falls evaluation completed     Most recent depression screenings:    10/12/2023    8:22 AM 10/05/2022    8:05 AM  PHQ 2/9 Scores  PHQ - 2 Score 0 0         Patient Care Team: Cydney Draft, MD as PCP - General (Family Medicine)   Outpatient Medications Prior to Visit  Medication Sig   AMBULATORY NON FORMULARY MEDICATION Supply ordered: CPAP and other supplies needed (headgear to fit, cushions, filters, heated tubing and water chamber) Dx: obstructive sleep apnea Settings: auto-titration 5-20 cmH2O   Multiple Vitamin (MULTIVITAMIN) capsule Take 1 capsule by mouth daily.   [DISCONTINUED] benzonatate  (TESSALON ) 200 MG capsule Take 1 capsule (200 mg total) by mouth 3 (three) times daily as needed. (Patient not taking: Reported on 08/13/2023)   [DISCONTINUED] diclofenac  sodium (VOLTAREN ) 1 % GEL Apply 2 g topically 4 (four) times daily. To affected joint. (Patient not taking: Reported on 08/13/2023)   No facility-administered medications prior to visit.    ROS        Objective:     BP 130/89   Pulse 72   Ht 6' (1.829 m)   Wt 238 lb 1.3 oz (108 kg)   SpO2 99%   BMI 32.29 kg/m     Physical Exam Constitutional:      Appearance: Normal appearance.  HENT:     Head: Normocephalic and atraumatic.     Right Ear:  Tympanic membrane, ear canal and external ear normal.     Left Ear: Tympanic membrane, ear canal and external ear normal.     Nose: Nose normal.     Mouth/Throat:     Pharynx: Oropharynx is clear.  Eyes:     Extraocular Movements: Extraocular movements intact.     Conjunctiva/sclera: Conjunctivae normal.     Pupils: Pupils are equal, round, and reactive to light.  Neck:     Thyroid: No thyromegaly.  Cardiovascular:     Rate and Rhythm: Normal rate and regular rhythm.  Pulmonary:     Effort: Pulmonary effort is normal.     Breath sounds: Normal breath sounds.  Abdominal:     General: Bowel sounds are normal.     Palpations: Abdomen is soft.     Tenderness: There is no abdominal tenderness.  Musculoskeletal:        General: No swelling.     Cervical back: Neck supple.  Skin:    General: Skin is warm and dry.  Neurological:     Mental Status: He is oriented to person, place, and time.  Psychiatric:        Mood and Affect: Mood normal.  Behavior: Behavior normal.      No results found for any visits on 10/12/23.      Assessment & Plan:    Routine Health Maintenance and Physical Exam  Immunization History  Administered Date(s) Administered   Influenza Whole 01/07/2011   Influenza,inj,Quad PF,6+ Mos 01/19/2013, 04/29/2017, 01/04/2018, 02/24/2021, 05/05/2022   Janssen (J&J) SARS-COV-2 Vaccination 07/14/2019   Tdap 12/04/2010, 09/04/2020    Health Maintenance  Topic Date Due   COVID-19 Vaccine (2 - 2024-25 season) 01/03/2023   INFLUENZA VACCINE  12/03/2023   DTaP/Tdap/Td (3 - Td or Tdap) 09/05/2030   Colonoscopy  08/12/2033   Hepatitis C Screening  Completed   HIV Screening  Completed   HPV VACCINES  Aged Out   Meningococcal B Vaccine  Aged Out    Discussed health benefits of physical activity, and encouraged him to engage in regular exercise appropriate for his age and condition.  Problem List Items Addressed This Visit   None Visit Diagnoses        Wellness examination    -  Primary   Relevant Orders   CMP14+EGFR   Lipid panel   CBC     Hemorrhoids, unspecified hemorrhoid type       Relevant Orders   Ambulatory referral to General Surgery     Other male erectile dysfunction         Skin tag          Keep up a regular exercise program and make sure you are eating a healthy diet Try to eat 4 servings of dairy a day, or if you are lactose intolerant take a calcium with vitamin D daily.  Your vaccines are up to date.   Hemorrhoids-he would like surgical referral he has had banding before and says it really did not work that well so would like consultation.  Will place general surgery referral.  He is also struggling with some erectile dysfunction but his wife has been having some chronic pelvic pain issues so it causes a lot of discomfort for her and that causes him to then have issues and not be able to maintain erection.  For like this is mostly psychological I do not think he has any risk for vascular issues at this point.  We could consider a trial of sildenafil if he would like at some point but I think at this point he is gena wait and have his wife try to do some pelvic PT first he can always just let me know and I can send in the prescription if needed.  Skin tags-I am happy to remove those at his convenience maybe at the end of the summer.  Arm happy to refer to dermatology if you would prefer.   No follow-ups on file.     Duaine German, MD

## 2023-10-12 NOTE — Telephone Encounter (Signed)
 Please call lab and have them add on a hemoglobin A1c.

## 2023-10-13 ENCOUNTER — Ambulatory Visit: Payer: Self-pay | Admitting: Family Medicine

## 2023-10-13 LAB — LIPID PANEL
Chol/HDL Ratio: 6.1 ratio — ABNORMAL HIGH (ref 0.0–5.0)
Cholesterol, Total: 202 mg/dL — ABNORMAL HIGH (ref 100–199)
HDL: 33 mg/dL — ABNORMAL LOW (ref >39)
LDL Chol Calc (NIH): 142 mg/dL — ABNORMAL HIGH (ref 0–99)
Triglycerides: 146 mg/dL (ref 0–149)
VLDL Cholesterol Cal: 27 mg/dL (ref 5–40)

## 2023-10-13 LAB — CMP14+EGFR
ALT: 29 IU/L (ref 0–44)
AST: 39 IU/L (ref 0–40)
Albumin: 4.5 g/dL (ref 4.1–5.1)
Alkaline Phosphatase: 58 IU/L (ref 44–121)
BUN/Creatinine Ratio: 12 (ref 9–20)
BUN: 12 mg/dL (ref 6–24)
Bilirubin Total: 0.8 mg/dL (ref 0.0–1.2)
CO2: 16 mmol/L — ABNORMAL LOW (ref 20–29)
Calcium: 9.3 mg/dL (ref 8.7–10.2)
Chloride: 107 mmol/L — ABNORMAL HIGH (ref 96–106)
Creatinine, Ser: 0.98 mg/dL (ref 0.76–1.27)
Globulin, Total: 2.4 g/dL (ref 1.5–4.5)
Glucose: 86 mg/dL (ref 70–99)
Potassium: 4.7 mmol/L (ref 3.5–5.2)
Sodium: 141 mmol/L (ref 134–144)
Total Protein: 6.9 g/dL (ref 6.0–8.5)
eGFR: 97 mL/min/{1.73_m2} (ref >59)

## 2023-10-13 LAB — CBC
Hematocrit: 48.2 % (ref 37.5–51.0)
Hemoglobin: 15.8 g/dL (ref 13.0–17.7)
MCH: 29.9 pg (ref 26.6–33.0)
MCHC: 32.8 g/dL (ref 31.5–35.7)
MCV: 91 fL (ref 79–97)
Platelets: 220 10*3/uL (ref 150–450)
RBC: 5.28 x10E6/uL (ref 4.14–5.80)
RDW: 12.3 % (ref 11.6–15.4)
WBC: 6 10*3/uL (ref 3.4–10.8)

## 2023-10-13 NOTE — Telephone Encounter (Signed)
 Added A1C to existing blood with [facility]

## 2023-10-13 NOTE — Progress Notes (Signed)
 Hi Micheal Hines, liver function looks good.  Your chloride level for some reason was off by 1 point not in a worrisome range so I am not concerned about it but just wanted to notate that.  Sodium levels look good.  Total cholesterol and LDL are mildly elevated.  And your HDL which is your good cholesterol is low just really encourage you to work on healthy diet and regular exercise to improve those numbers.  Blood count looks great.  Please call lab and see if we can add on a testosterone level I forgot to have that drawn.

## 2023-10-14 LAB — HGB A1C W/O EAG: Hgb A1c MFr Bld: 5.3 % (ref 4.8–5.6)

## 2023-10-14 LAB — SPECIMEN STATUS REPORT

## 2023-10-15 LAB — TESTOSTERONE,FREE AND TOTAL
Testosterone, Free: 12.2 pg/mL (ref 6.8–21.5)
Testosterone: 472 ng/dL (ref 264–916)

## 2023-10-15 LAB — SPECIMEN STATUS REPORT

## 2023-10-15 NOTE — Progress Notes (Signed)
 Micheal Hines, we were able to get them to add on the testosterone  levels and they are normal.  No concern for low testosterone  at this time.

## 2023-11-01 ENCOUNTER — Encounter: Payer: Self-pay | Admitting: Family Medicine

## 2023-11-01 DIAGNOSIS — G4733 Obstructive sleep apnea (adult) (pediatric): Secondary | ICD-10-CM

## 2023-11-10 NOTE — Telephone Encounter (Signed)
 Tonya/Dr. Alvan, I am unable to locate orders for CPAP replacement. Patient may also need updated sleep study. Please advise.

## 2023-11-10 NOTE — Telephone Encounter (Signed)
 Will place new order and we can put into parachute.   Please let pt know when ordered.

## 2023-11-11 MED ORDER — AMBULATORY NON FORMULARY MEDICATION: 99 refills | Status: AC

## 2023-11-22 ENCOUNTER — Ambulatory Visit: Payer: Self-pay | Admitting: General Surgery

## 2023-11-22 NOTE — H&P (Signed)
 REFERRING PHYSICIAN:  Alvan Dorothyann Raring*  PROVIDER:  BERNARDA WANDA NED, MD  MRN: [id number] DOB: [birth year] DATE OF ENCOUNTER: 11/22/2023  Subjective   Chief Complaint: Hemorrhoids     History of Present Illness: Micheal Hines is a 48 y.o. male who is seen today as an office consultation at the request of Dr. Alvan for evaluation of Hemorrhoids .  48 year old male with a longstanding history of a prolapsed internal hemorrhoid.  This was also noted on recent colonoscopy.  He reports having to reduce the hemorrhoid with every bowel movement.  He underwent banding of some other hemorrhoids in the past but this was too big to be banded.  He elected not to undergo surgery at that time.  He reports ongoing issues with bleeding and urgency.  He would like to have it removed if possible.   Review of Systems: A complete review of systems was obtained from the patient.  I have reviewed this information and discussed as appropriate with the patient.  See HPI as well for other ROS.   Medical History: Past Medical History:  Diagnosis Date   Sleep apnea     Patient Active Problem List  Diagnosis   OSA (obstructive sleep apnea)    History reviewed. No pertinent surgical history.   No Known Allergies  Current Outpatient Medications on File Prior to Visit  Medication Sig Dispense Refill   diclofenac  (VOLTAREN ) 1 % topical gel Apply 2 g topically 4 (four) times daily     diclofenac  (VOLTAREN ) 1 % topical gel Apply 2 g topically 4 (four) times daily     multivitamin with minerals Cap Take 1 capsule by mouth once daily     No current facility-administered medications on file prior to visit.    Family History  Problem Relation Age of Onset   High blood pressure (Hypertension) Father    Diabetes Father    Diabetes Sister      Social History   Tobacco Use  Smoking Status Never  Smokeless Tobacco Never     Social History   Socioeconomic History   Marital status:  Married  Tobacco Use   Smoking status: Never   Smokeless tobacco: Never  Vaping Use   Vaping status: Never Used  Substance and Sexual Activity   Drug use: Never   Social Drivers of Corporate investment banker Strain: Low Risk  (06/16/2023)   Received from [facility]   Overall Financial Resource Strain (CARDIA)    Difficulty of Paying Living Expenses: Not hard at all  Food Insecurity: No Food Insecurity (06/16/2023)   Received from [facility]   Hunger Vital Sign    Within the past 12 months, you worried that your food would run out before you got the money to buy more.: Never true    Within the past 12 months, the food you bought just didn't last and you didn't have money to get more.: Never true  Transportation Needs: No Transportation Needs (06/16/2023)   Received from [facility] - Transportation    Lack of Transportation (Medical): No    Lack of Transportation (Non-Medical): No  Physical Activity: Unknown (06/16/2023)   Received from [facility]   Exercise Vital Sign    On average, how many days per week do you engage in moderate to strenuous exercise (like a brisk walk)?: Patient declined  Stress: Stress Concern Present (06/16/2023)   Received from [facility] - Occupational Stress Questionnaire  Feeling of Stress : To some extent  Social Connections: Moderately Isolated (06/16/2023)   Received from [facility]   Social Connection and Isolation Panel    In a typical week, how many times do you talk on the phone with family, friends, or neighbors?: More than three times a week    How often do you get together with friends or relatives?: Once a week    How often do you attend church or religious services?: Never    Do you belong to any clubs or organizations such as church groups, unions, fraternal or athletic groups, or school groups?: No    Are you married, widowed, divorced, separated, never married, or living with a  partner?: Married  Housing Stability: Unknown (11/22/2023)   Housing Stability Vital Sign    Homeless in the Last Year: No    Objective:    Vitals:   11/22/23 1057 11/22/23 1058  BP: (!) 142/90   Pulse: 93   Temp: 37.1 C (98.7 F)   SpO2: 97%   Weight: (!) 107.8 kg (237 lb 9.6 oz)   Height: 182.9 cm (6')   PainSc:  0-No pain     Exam Gen: NAD Abd: Soft Rectal: good tone, no external disease   Labs, Imaging and Diagnostic Testing:  Procedure: Anoscopy Surgeon: Micheal Hines After the risks and benefits were explained, written consent was obtained for above procedure.  A medical assistant chaperone was present thoroughout the entire procedure.  Anesthesia: none Diagnosis: hemorrhoids  Findings: grade 3 large R posterior int hem, grade 1 LL and RA   Assessment and Plan:  Diagnoses and all orders for this visit:  Prolapsed internal hemorrhoids, grade 55  48 year old male with a large grade 3 hemorrhoid.  We discussed excision as his best option for getting rid of his pathology.  We discussed the typical healing time for this type of incision.  We discussed that if his other internal hemorrhoids need any small procedures we will go ahead and do that at the same time.  We discussed risk of bleeding pain and recurrence.  All questions were answered.  Patient would like to proceed with single column hemorrhoidectomy.  Bernarda JAYSON Debby, MD Colon and Rectal Surgery [facility]

## 2023-12-30 ENCOUNTER — Other Ambulatory Visit: Payer: Self-pay

## 2023-12-30 ENCOUNTER — Encounter (HOSPITAL_BASED_OUTPATIENT_CLINIC_OR_DEPARTMENT_OTHER): Payer: Self-pay | Admitting: General Surgery

## 2024-01-04 ENCOUNTER — Encounter: Payer: Self-pay | Admitting: Sports Medicine

## 2024-01-04 ENCOUNTER — Encounter: Payer: Self-pay | Admitting: Family Medicine

## 2024-01-04 MED ORDER — TADALAFIL 20 MG PO TABS: 10.0000 mg | ORAL_TABLET | ORAL | 11 refills | Status: AC | PRN

## 2024-01-07 ENCOUNTER — Encounter (HOSPITAL_BASED_OUTPATIENT_CLINIC_OR_DEPARTMENT_OTHER): Admission: RE | Payer: Self-pay | Source: Home / Self Care

## 2024-01-07 ENCOUNTER — Ambulatory Visit (HOSPITAL_BASED_OUTPATIENT_CLINIC_OR_DEPARTMENT_OTHER): Admission: RE | Admit: 2024-01-07 | Source: Home / Self Care | Admitting: General Surgery

## 2024-01-07 SURGERY — HEMORRHOIDECTOMY
Anesthesia: Choice

## 2024-04-26 ENCOUNTER — Ambulatory Visit
Admission: RE | Admit: 2024-04-26 | Discharge: 2024-04-26 | Disposition: A | Discharge status: Home / Self Care | Attending: Family Medicine | Admitting: Family Medicine

## 2024-04-26 ENCOUNTER — Other Ambulatory Visit: Payer: Self-pay

## 2024-04-26 VITALS — BP 144/90 | HR 100 | Temp 99.7°F | Resp 20 | Ht 72.0 in | Wt 230.0 lb

## 2024-04-26 DIAGNOSIS — R6889 Other general symptoms and signs: Secondary | ICD-10-CM

## 2024-04-26 LAB — POC SOFIA SARS ANTIGEN FIA: SARS Coronavirus 2 Ag: NEGATIVE

## 2024-04-26 LAB — POCT INFLUENZA A/B
Influenza A, POC: NEGATIVE
Influenza B, POC: NEGATIVE

## 2024-04-26 NOTE — ED Triage Notes (Signed)
 Pt presenting with c/o body aches, chills, fever and cough x 1 day. Pt stated that he had episodes of vomiting and diarrhea yesterday but not today. Pt stated he took DayQuil this AM with minimal effectiveness.

## 2024-04-26 NOTE — ED Provider Notes (Signed)
 " [facility]    CSN: [id number] Arrival date & time: 04/26/24  1317      History   Chief Complaint Chief Complaint  Patient presents with   Influenza    Fever, body aches, cough.  Previously had vomiting and diarrhea. - Entered by patient   Fever    HPI Micheal Hines is a 48 y.o. male.   HPI 48 year old male presents with fever, body aches, and, cough.  Patient reports previously having vomiting and diarrhea.  PMH significant for OSA, obesity, and HLD  Past Medical History:  Diagnosis Date   Allergy    COVID-19 virus infection 11/19/2020   GERD (gastroesophageal reflux disease)    Hyperlipidemia    Shingles    Sleep apnea 2017   on CPAP   Wears glasses     Patient Active Problem List   Diagnosis Date Noted   Nasal congestion 05/20/2023   Chronic pain of left ankle 06/30/2021   OSA (obstructive sleep apnea) 06/28/2015   Hyperlipidemia 01/14/2012    Past Surgical History:  Procedure Laterality Date   COLONOSCOPY     WISDOM TOOTH EXTRACTION         Home Medications    Prior to Admission medications  Medication Sig Start Date End Date Taking? Authorizing Provider  AMBULATORY NON FORMULARY MEDICATION Supply ordered: CPAP and other supplies needed (headgear to fit, cushions, filters, heated tubing and water chamber) Dx: obstructive sleep apnea Settings: auto-titration 5-18 cmH2O and send download after 2 weeks so we can set pressure 11/11/23   Alvan Dorothyann BIRCH, MD  diclofenac  (VOLTAREN ) 50 MG EC tablet Take 50 mg by mouth 2 (two) times daily.    [provider]  Multiple Vitamin (MULTIVITAMIN) capsule Take 1 capsule by mouth daily.    [provider]  tadalafil  (CIALIS ) 20 MG tablet Take 0.5-1 tablets (10-20 mg total) by mouth every other day as needed for erectile dysfunction. 01/04/24   Alvan Dorothyann BIRCH, MD    Family History Family History  Problem Relation Age of Onset   Diabetes Father    Hyperlipidemia Father     Prostate cancer Maternal Grandfather    Breast cancer Paternal Grandmother    Colon cancer Neg Hx    Esophageal cancer Neg Hx    Rectal cancer Neg Hx    Stomach cancer Neg Hx    Colon polyps Neg Hx     Social History Social History[1]   Allergies   Patient has no known allergies.   Review of Systems Review of Systems  Respiratory:  Positive for cough.   Gastrointestinal:  Positive for nausea and vomiting.  All other systems reviewed and are negative.    Physical Exam Triage Vital Signs ED Triage Vitals  Encounter Vitals Group     BP      Girls Systolic BP Percentile      Girls Diastolic BP Percentile      Boys Systolic BP Percentile      Boys Diastolic BP Percentile      Pulse      Resp      Temp      Temp src      SpO2      Weight      Height      Head Circumference      Peak Flow      Pain Score      Pain Loc      Pain Education      Exclude  from Growth Chart    No data found.  Updated Vital Signs BP (!) 144/90 (BP Location: Right Arm)   Pulse 100   Temp 99.7 F (37.6 C) (Oral)   Resp 20   Ht 6' (1.829 m)   Wt 230 lb (104.3 kg)   SpO2 98%   BMI 31.19 kg/m     Physical Exam Vitals and nursing note reviewed.  Constitutional:      Appearance: Normal appearance. He is normal weight. He is not ill-appearing.  HENT:     Head: Normocephalic and atraumatic.     Right Ear: Tympanic membrane, ear canal and external ear normal.     Left Ear: Tympanic membrane, ear canal and external ear normal.     Mouth/Throat:     Mouth: Mucous membranes are moist.     Pharynx: Oropharynx is clear.  Eyes:     Extraocular Movements: Extraocular movements intact.     Conjunctiva/sclera: Conjunctivae normal.     Pupils: Pupils are equal, round, and reactive to light.  Cardiovascular:     Heart sounds: Normal heart sounds.  Pulmonary:     Effort: Pulmonary effort is normal.     Breath sounds: Normal breath sounds. No wheezing, rhonchi or rales.   Musculoskeletal:        General: Normal range of motion.  Skin:    General: Skin is warm and dry.  Neurological:     General: No focal deficit present.     Mental Status: He is alert and oriented to person, place, and time.  Psychiatric:        Mood and Affect: Mood normal.        Behavior: Behavior normal.        Thought Content: Thought content normal.      UC Treatments / Results  Labs (all labs ordered are listed, but only abnormal results are displayed) Labs Reviewed  POC SOFIA SARS ANTIGEN FIA - Normal  POCT INFLUENZA A/B - Normal    EKG   Radiology No results found.  Procedures Procedures (including critical care time)  Medications Ordered in UC Medications - No data to display  Initial Impression / Assessment and Plan / UC Course  I have reviewed the triage vital signs and the nursing notes.  Pertinent labs & imaging results that were available during my care of the patient were reviewed by me and considered in my medical decision making (see chart for details).     MDM: 1.  Influenza-like illness-advised patient may take OTC Tylenol  1000 mg every 6 hours for fever (oral temperature greater than 100.3).  Encouraged increase daily water intake to 64 ounces per day while taking this medication.  Advised if symptoms worsen and/or unresolved please follow with PCP or here for further evaluation.  Final Clinical Impressions(s) / UC Diagnoses   Final diagnoses:  Influenza-like symptoms     Discharge Instructions      Advised patient may take OTC Tylenol  1000 mg every 6 hours for fever (oral temperature greater than 100.3).  Encouraged increase daily water intake to 64 ounces per day while taking these medications.  Advised if symptoms worsen and are unresolved please follow-up with PCP or here for further evaluation.     ED Prescriptions   None    PDMP not reviewed this encounter.    [1]  Social History Tobacco Use   Smoking status: Never    Smokeless tobacco: Never  Vaping Use   Vaping status: Never Used  Substance Use Topics   Alcohol use: Yes    Comment: rarely   Drug use: No     Teddy Sharper, FNP 04/26/24 1424  "

## 2024-04-26 NOTE — Discharge Instructions (Addendum)
 Advised patient may take OTC Tylenol  1000 mg every 6 hours for fever (oral temperature greater than 100.3).  Encouraged increase daily water intake to 64 ounces per day while taking these medications.  Advised if symptoms worsen and are unresolved please follow-up with PCP or here for further evaluation.

## 2024-10-12 ENCOUNTER — Encounter: Admitting: Family Medicine
